# Patient Record
Sex: Female | Born: 1990 | Race: Black or African American | Hispanic: No | Marital: Single | Smoking: Never smoker
Health system: Southern US, Community
[De-identification: ages and names within clinical notes are randomized; demographics above are authoritative.]

## PROBLEM LIST (undated history)

## (undated) ENCOUNTER — Inpatient Hospital Stay (HOSPITAL_COMMUNITY): Payer: Self-pay

## (undated) DIAGNOSIS — R002 Palpitations: Secondary | ICD-10-CM

## (undated) DIAGNOSIS — Z8632 Personal history of gestational diabetes: Secondary | ICD-10-CM

## (undated) DIAGNOSIS — A6000 Herpesviral infection of urogenital system, unspecified: Secondary | ICD-10-CM

## (undated) HISTORY — DX: Personal history of gestational diabetes: Z86.32

## (undated) HISTORY — DX: Herpesviral infection of urogenital system, unspecified: A60.00

---
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
## 2010-05-28 LAB — GC/CHLAMYDIA PROBE AMP, GENITAL: GC Probe Amp, Genital: NEGATIVE

## 2010-05-28 LAB — URINALYSIS, ROUTINE W REFLEX MICROSCOPIC
Glucose, UA: NEGATIVE mg/dL
Hgb urine dipstick: NEGATIVE
Ketones, ur: NEGATIVE mg/dL
Protein, ur: NEGATIVE mg/dL

## 2010-05-28 LAB — URINE CULTURE
Colony Count: NO GROWTH
Culture: NO GROWTH

## 2010-05-28 LAB — POCT PREGNANCY, URINE: Preg Test, Ur: NEGATIVE

## 2011-03-28 ENCOUNTER — Emergency Department (HOSPITAL_COMMUNITY)
Admission: EM | Admit: 2011-03-28 | Discharge: 2011-03-29 | Disposition: A | Discharge status: Home / Self Care | Payer: PRIVATE HEALTH INSURANCE | Attending: Emergency Medicine | Admitting: Emergency Medicine

## 2011-03-28 DIAGNOSIS — R21 Rash and other nonspecific skin eruption: Secondary | ICD-10-CM | POA: Insufficient documentation

## 2011-03-28 DIAGNOSIS — N898 Other specified noninflammatory disorders of vagina: Secondary | ICD-10-CM | POA: Insufficient documentation

## 2011-03-28 DIAGNOSIS — A6000 Herpesviral infection of urogenital system, unspecified: Secondary | ICD-10-CM

## 2011-03-28 DIAGNOSIS — N949 Unspecified condition associated with female genital organs and menstrual cycle: Secondary | ICD-10-CM | POA: Insufficient documentation

## 2011-03-29 ENCOUNTER — Encounter (HOSPITAL_COMMUNITY): Payer: Self-pay | Admitting: *Deleted

## 2011-03-29 LAB — HERPES SIMPLEX VIRUS CULTURE: Culture: DETECTED

## 2011-03-29 LAB — WET PREP, GENITAL

## 2011-03-29 MED ORDER — CEFTRIAXONE SODIUM 250 MG IJ SOLR
250.0000 mg | Freq: Once | INTRAMUSCULAR | Status: AC
  Administered 2011-03-29: 250 mg via INTRAMUSCULAR
  Filled 2011-03-29: qty 250

## 2011-03-29 MED ORDER — AZITHROMYCIN 1 G PO PACK
1.0000 g | PACK | Freq: Once | ORAL | Status: AC
  Administered 2011-03-29: 1 g via ORAL
  Filled 2011-03-29: qty 1

## 2011-03-29 MED ORDER — VALACYCLOVIR HCL 1 G PO TABS: 1000.0000 mg | ORAL_TABLET | Freq: Two times a day (BID) | ORAL | Status: AC

## 2011-03-29 MED ORDER — VALACYCLOVIR HCL 500 MG PO TABS
1000.0000 mg | ORAL_TABLET | ORAL | Status: AC
  Administered 2011-03-29: 1000 mg via ORAL
  Filled 2011-03-29: qty 2

## 2011-03-29 NOTE — ED Notes (Signed)
Pelvic cart set up 

## 2011-03-29 NOTE — ED Provider Notes (Signed)
History     CSN: [id number]  Arrival date & time 03/28/11  2355   First MD Initiated Contact with Patient 03/29/11 0121      Chief Complaint  Patient presents with  . Pain    (Consider location/radiation/quality/duration/timing/severity/associated sxs/prior treatment) HPI Julie Compton is a 21 y.o. female presents with c/o vaginal rash leading to desire to be assessed in the ED. The sx(s) have been present for 1 day. Additional concerns are unprotected sexual intercourse 1 week ago. Causative factors are sex. Palliative factors are none. The distress associated is moderate. The disorder has been present for 1 day    .History reviewed. No pertinent past medical history.  History reviewed. No pertinent past surgical history.  History reviewed. No pertinent family history.  History  Substance Use Topics  . Smoking status: Never Smoker   . Smokeless tobacco: Not on file  . Alcohol Use: No    OB History    Grav Para Term Preterm Abortions TAB SAB Ect Mult Living                  Review of Systems  All other systems reviewed and are negative.    Allergies  Review of patient's allergies indicates no known allergies.  Home Medications   Current Outpatient Rx  Name Route Sig Dispense Refill  . VAGISIL YEAST-CONTROL VA SUPP Vaginal Place 1 suppository vaginally once.    Marland Kitchen VALACYCLOVIR HCL 1 G PO TABS Oral Take 1 tablet (1,000 mg total) by mouth 2 (two) times daily. 20 tablet 0    BP 123/89  Pulse 103  Temp 99 F (37.2 C)  Resp 14  SpO2 100%  Physical Exam  Nursing note and vitals reviewed. Constitutional: She is oriented to person, place, and time. She appears well-developed and well-nourished.  HENT:  Head: Normocephalic and atraumatic.  Eyes: Conjunctivae and EOM are normal. Pupils are equal, round, and reactive to light.  Neck: Normal range of motion and phonation normal. Neck supple.  Cardiovascular: Normal rate, regular rhythm and intact distal  pulses.   Pulmonary/Chest: Effort normal and breath sounds normal. She exhibits no tenderness.  Abdominal: Soft. She exhibits no distension. There is no tenderness. There is no guarding.  Genitourinary: Vaginal discharge found.       Vulvar and vaginal rash, consistent with herpes, characterized by small vesicles and ulcerations on red bases. Vaginal discharge is discolored. Patient has severe pain at the vaginal introitus due to the rash. Pain allows speculum exam, but not bimanual exam.  Musculoskeletal: Normal range of motion.  Neurological: She is alert and oriented to person, place, and time. She has normal strength. She exhibits normal muscle tone.  Skin: Skin is warm and dry.  Psychiatric: She has a normal mood and affect. Her behavior is normal. Judgment and thought content normal.    ED Course  Procedures (including critical care time)   Labs Reviewed  GC/CHLAMYDIA PROBE AMP, GENITAL  WET PREP, GENITAL  HERPES SIMPLEX VIRUS CULTURE   No results found.   1. Primary genital herpes simplex infection       MDM  Evaluation consistent with primary genital herpes. Treatment initiated in the ED for Gonorrhea Chlamydia and herpes.        Flint Melter, MD 03/29/11 [phone]

## 2011-03-29 NOTE — ED Notes (Signed)
Patient with vaginal pain and discharge.  Also c/o headache

## 2011-04-01 ENCOUNTER — Telehealth (HOSPITAL_COMMUNITY): Payer: Self-pay | Admitting: *Deleted

## 2011-04-01 NOTE — ED Notes (Signed)
Patient returned call and informed of positive results after  id'd x 2 .

## 2011-04-01 NOTE — ED Notes (Signed)
+   Herpes culture Patient treated with valtrex.

## 2012-09-23 ENCOUNTER — Encounter (HOSPITAL_COMMUNITY): Payer: Self-pay | Admitting: *Deleted

## 2012-09-23 ENCOUNTER — Emergency Department (HOSPITAL_COMMUNITY)
Admission: EM | Admit: 2012-09-23 | Discharge: 2012-09-23 | Disposition: A | Discharge status: Home / Self Care | Payer: Self-pay | Source: Home / Self Care

## 2012-09-23 DIAGNOSIS — R7309 Other abnormal glucose: Secondary | ICD-10-CM

## 2012-09-23 NOTE — ED Provider Notes (Signed)
   History    CSN: [id number] Arrival date & time 09/23/12  1645  First MD Initiated Contact with Patient 09/23/12 1814     Chief Complaint  Patient presents with  . Hyperglycemia   (Consider location/radiation/quality/duration/timing/severity/associated sxs/prior Treatment) HPI Comments: As above. Feeling drowsy and sluggish this AM. Her mother chk'd her BS on her machine twice and was 200 each time.  Denies pollyuria/phagia/dyspia. No other sx's.  History reviewed. No pertinent past medical history. History reviewed. No pertinent past surgical history. No family history on file. History  Substance Use Topics  . Smoking status: Never Smoker   . Smokeless tobacco: Not on file  . Alcohol Use: No   OB History   Grav Para Term Preterm Abortions TAB SAB Ect Mult Living                 Review of Systems  All other systems reviewed and are negative.    Allergies  Review of patient's allergies indicates no known allergies.  Home Medications   Current Outpatient Rx  Name  Route  Sig  Dispense  Refill  . Miscellaneous Vaginal Products (VAGISIL YEAST-CONTROL) SUPP   Vaginal   Place 1 suppository vaginally once.          BP 138/90  Pulse 89  Temp(Src) 98.2 F (36.8 C) (Oral)  Resp 17  SpO2 99%  LMP 09/01/2012 Physical Exam  Nursing note and vitals reviewed. Constitutional: She is oriented to person, place, and time. She appears well-developed and well-nourished. No distress.  HENT:  Head: Normocephalic and atraumatic.  Mouth/Throat: Oropharynx is clear and moist. No oropharyngeal exudate.  Eyes: EOM are normal. Pupils are equal, round, and reactive to light.  Neck: Normal range of motion. Neck supple.  Cardiovascular: Normal rate, regular rhythm and normal heart sounds.   Pulmonary/Chest: Effort normal and breath sounds normal. No respiratory distress.  Abdominal: Soft. There is no tenderness.  Musculoskeletal: Normal range of motion.  Neurological: She is alert  and oriented to person, place, and time. No cranial nerve deficit.  Skin: Skin is warm and dry.  Psychiatric: She has a normal mood and affect.    ED Course  Procedures (including critical care time) Labs Reviewed  GLUCOSE, CAPILLARY   No results found. 1. Elevated random blood glucose level     MDM  Suspect the 2 AM home readings of 200 each time may be artifactual. BS here 71. She is asymptomatic of Polyuria/dypsia/phagia. Will give diabetes info and have her call for appointment for further workup.  Hayden Rasmussen, NP 09/23/12 1844

## 2012-09-23 NOTE — ED Notes (Signed)
Patient states her mother wanted her to get checked out because she checked her CBG this morning on her mother's machine, and it was 200 (states was a fasting CBG).  Denies any c/o's or sxs.  Denies any increased thirst or polyuria.  States feels completely fine.

## 2012-09-23 NOTE — ED Provider Notes (Signed)
Medical screening examination/treatment/procedure(s) were performed by non-physician practitioner and as supervising physician I was immediately available for consultation/collaboration.  Idell Hissong   Tamana Hatfield, MD 09/23/12 1907 

## 2013-03-15 DIAGNOSIS — A6000 Herpesviral infection of urogenital system, unspecified: Secondary | ICD-10-CM

## 2013-03-15 HISTORY — DX: Herpesviral infection of urogenital system, unspecified: A60.00

## 2014-06-03 ENCOUNTER — Encounter (HOSPITAL_COMMUNITY): Payer: Self-pay

## 2014-06-03 ENCOUNTER — Other Ambulatory Visit: Payer: Self-pay

## 2014-06-03 ENCOUNTER — Emergency Department (INDEPENDENT_AMBULATORY_CARE_PROVIDER_SITE_OTHER)
Admission: EM | Admit: 2014-06-03 | Discharge: 2014-06-03 | Disposition: A | Discharge status: Home / Self Care | Payer: Self-pay | Source: Home / Self Care | Attending: Emergency Medicine | Admitting: Emergency Medicine

## 2014-06-03 DIAGNOSIS — I493 Ventricular premature depolarization: Secondary | ICD-10-CM

## 2014-06-03 DIAGNOSIS — R002 Palpitations: Secondary | ICD-10-CM

## 2014-06-03 NOTE — Discharge Instructions (Signed)
Palpitations °A palpitation is the feeling that your heartbeat is irregular or is faster than normal. It may feel like your heart is fluttering or skipping a beat. Palpitations are usually not a serious problem. However, in some cases, you may need further medical evaluation. °CAUSES  °Palpitations can be caused by: °· Smoking. °· Caffeine or other stimulants, such as diet pills or energy drinks. °· Alcohol. °· Stress and anxiety. °· Strenuous physical activity. °· Fatigue. °· Certain medicines. °· Heart disease, especially if you have a history of irregular heart rhythms (arrhythmias), such as atrial fibrillation, atrial flutter, or supraventricular tachycardia. °· An improperly working pacemaker or defibrillator. °DIAGNOSIS  °To find the cause of your palpitations, your health care provider will take your medical history and perform a physical exam. Your health care provider may also have you take a test called an ambulatory electrocardiogram (ECG). An ECG records your heartbeat patterns over a 24-hour period. You may also have other tests, such as: °· Transthoracic echocardiogram (TTE). During echocardiography, sound waves are used to evaluate how blood flows through your heart. °· Transesophageal echocardiogram (TEE). °· Cardiac monitoring. This allows your health care provider to monitor your heart rate and rhythm in real time. °· Holter monitor. This is a portable device that records your heartbeat and can help diagnose heart arrhythmias. It allows your health care provider to track your heart activity for several days, if needed. °· Stress tests by exercise or by giving medicine that makes the heart beat faster. °TREATMENT  °Treatment of palpitations depends on the cause of your symptoms and can vary greatly. Most cases of palpitations do not require any treatment other than time, relaxation, and monitoring your symptoms. Other causes, such as atrial fibrillation, atrial flutter, or supraventricular  tachycardia, usually require further treatment. °HOME CARE INSTRUCTIONS  °· Avoid: °¨ Caffeinated coffee, tea, soft drinks, diet pills, and energy drinks. °¨ Chocolate. °¨ Alcohol. °· Stop smoking if you smoke. °· Reduce your stress and anxiety. Things that can help you relax include: °¨ A method of controlling things in your body, such as your heartbeats, with your mind (biofeedback). °¨ Yoga. °¨ Meditation. °¨ Physical activity such as swimming, jogging, or walking. °· Get plenty of rest and sleep. °SEEK MEDICAL CARE IF:  °· You continue to have a fast or irregular heartbeat beyond 24 hours. °· Your palpitations occur more often. °SEEK IMMEDIATE MEDICAL CARE IF: °· You have chest pain or shortness of breath. °· You have a severe headache. °· You feel dizzy or you faint. °MAKE SURE YOU: °· Understand these instructions. °· Will watch your condition. °· Will get help right away if you are not doing well or get worse. °Document Released: 02/27/2000 Document Revised: 03/06/2013 Document Reviewed: 04/30/2011 °ExitCare® Patient Information ©2015 [employer]. This information is not intended to replace advice given to you by your health care provider. Make sure you discuss any questions you have with your health care provider. °Premature Ventricular Contraction °Premature ventricular contraction (PVC) is an irregularity of the heart rhythm involving extra or skipped heartbeats. In some cases, they may occur without obvious cause or heart disease. °Other times, they can be caused by an electrolyte change in the blood. These need to be corrected. They can also be seen when there is not enough oxygen going to the heart. A common cause of this is plaque or cholesterol buildup. This buildup decreases the blood supply to the heart. In addition, extra beats may be caused or aggravated by: °·   Excessive smoking. °· Alcohol consumption. °· Caffeine. °· Certain medications °· Some street drugs. °SYMPTOMS  °· The sensation of  feeling your heart skipping a beat (palpitations). °· In many cases, the person may have no symptoms. °SIGNS AND TESTS  °· A physical examination may show an occasional irregularity, but if the PVC beats do not happen often, they may not be found on physical exam. °· Blood pressure is usually normal. °· Other tests that may find extra beats of the heart are: °¨ An EKG (electrocardiogram) °¨ A Holter monitor which can monitor your heart over longer periods of time °¨ An Angiogram (study of the heart arteries). °TREATMENT  °Usually extra heartbeats do not need treatment. The condition is treated only if symptoms are severe or if extra beats are very frequent or are causing problems. An underlying cause, if discovered, may also require treatment.  °Treatment may also be needed if there may be a risk for other more serious cardiac arrhythmias.  °PREVENTION  °· Moderation in caffeine, alcohol, and tobacco use may reduce the risk of ectopic heartbeats in some people. °· Exercise often helps people who lead a sedentary (inactive) lifestyle. °PROGNOSIS  °PVC heartbeats are generally harmless and do not need treatment.  °RISKS AND COMPLICATIONS  °· Ventricular tachycardia (occasionally). °· There usually are no complications. °· Other arrhythmias (occasionally). °SEEK IMMEDIATE MEDICAL CARE IF:  °· You feel palpitations that are frequent or continual. °· You develop chest pain or other problems such as shortness of breath, sweating, or nausea and vomiting. °· You become light-headed or faint (pass out). °· You get worse or do not improve with treatment. °Document Released: 10/17/2003 Document Revised: 05/24/2011 Document Reviewed: 04/28/2007 °ExitCare® Patient Information ©2015 [employer]. This information is not intended to replace advice given to you by your health care provider. Make sure you discuss any questions you have with your health care provider. ° °

## 2014-06-03 NOTE — ED Notes (Signed)
C/o 3 week duration of palpitations. Denies n/v/SOB, sweats

## 2014-06-03 NOTE — ED Provider Notes (Signed)
CSN: [id number]     Arrival date & time 06/03/14  1919 History   First MD Initiated Contact with Patient 06/03/14 1959     Chief Complaint  Patient presents with  . Palpitations   (Consider location/radiation/quality/duration/timing/severity/associated sxs/prior Treatment) HPI          24 year old female presents for evaluation of palpitations. For 3 weeks she has intermittently felt her heart skip a beat. This happens a few times a day. She thinks it maybe one time she felt slightly short of breath but other than that this is been asymptomatic apart from the feeling of palpitations. Denies any other associated symptoms  No past medical history on file. No past surgical history on file. No family history on file. History  Substance Use Topics  . Smoking status: Never Smoker   . Smokeless tobacco: Not on file  . Alcohol Use: No   OB History    No data available     Review of Systems  Respiratory: Positive for shortness of breath.   Cardiovascular: Positive for palpitations.  All other systems reviewed and are negative.   Allergies  Review of patient's allergies indicates no known allergies.  Home Medications   Prior to Admission medications   Medication Sig Start Date End Date Taking? Authorizing Provider  Miscellaneous Vaginal Products (VAGISIL YEAST-CONTROL) SUPP Place 1 suppository vaginally once.    Historical Provider, MD   BP 144/81 mmHg  Pulse 85  Temp(Src) 99.1 F (37.3 C) (Oral)  Resp 16  SpO2 100% Physical Exam  Constitutional: She is oriented to person, place, and time. Vital signs are normal. She appears well-developed and well-nourished. No distress.  HENT:  Head: Normocephalic and atraumatic.  Cardiovascular: Normal rate, regular rhythm, normal heart sounds and normal pulses.   Pulmonary/Chest: Effort normal and breath sounds normal. No respiratory distress.  Neurological: She is alert and oriented to person, place, and time. She has normal strength.  Coordination normal.  Skin: Skin is warm and dry. No rash noted. She is not diaphoretic.  Psychiatric: She has a normal mood and affect. Judgment normal.  Nursing note and vitals reviewed.   ED Course  ED EKG  Date/Time: 06/03/2014 8:15 PM Performed by: Graylon GoodBAKER, Clemmie Buelna H Authorized by: Charm RingsHONIG, ERIN J Comparison: not compared with previous ECG  Rhythm: sinus rhythm Rate: normal QRS axis: normal Conduction: conduction normal ST Segments: ST segments normal T Waves: T waves normal Other: no other findings Clinical impression: normal ECG Comments: EKG reviewed as above Rhythm strip revealed 3 unifocal PVCs   (including critical care time) Labs Review Labs Reviewed - No data to display  Imaging Review No results found.   MDM   1. Palpitations   2. PVC (premature ventricular contraction)    PVC, this explains the palpitations. No treatment indicated. I provided her information for the cardiologist's office, she may follow-up if this starts to bother her. All questions answered.       Graylon GoodZachary H Shaterria Sager, PA-C 06/03/14 2020

## 2015-02-26 ENCOUNTER — Emergency Department (HOSPITAL_COMMUNITY)
Admission: EM | Admit: 2015-02-26 | Discharge: 2015-02-26 | Disposition: A | Discharge status: Home / Self Care | Payer: No Typology Code available for payment source | Attending: Emergency Medicine | Admitting: Emergency Medicine

## 2015-02-26 ENCOUNTER — Encounter (HOSPITAL_COMMUNITY): Payer: Self-pay | Admitting: Emergency Medicine

## 2015-02-26 DIAGNOSIS — Z792 Long term (current) use of antibiotics: Secondary | ICD-10-CM | POA: Insufficient documentation

## 2015-02-26 DIAGNOSIS — H7292 Unspecified perforation of tympanic membrane, left ear: Secondary | ICD-10-CM

## 2015-02-26 DIAGNOSIS — W228XXA Striking against or struck by other objects, initial encounter: Secondary | ICD-10-CM | POA: Insufficient documentation

## 2015-02-26 DIAGNOSIS — Y9389 Activity, other specified: Secondary | ICD-10-CM | POA: Insufficient documentation

## 2015-02-26 DIAGNOSIS — S0922XA Traumatic rupture of left ear drum, initial encounter: Secondary | ICD-10-CM | POA: Insufficient documentation

## 2015-02-26 DIAGNOSIS — Y9289 Other specified places as the place of occurrence of the external cause: Secondary | ICD-10-CM | POA: Insufficient documentation

## 2015-02-26 DIAGNOSIS — Y998 Other external cause status: Secondary | ICD-10-CM | POA: Insufficient documentation

## 2015-02-26 MED ORDER — OFLOXACIN 0.3 % OP SOLN: 5.0000 [drp] | Freq: Two times a day (BID) | OPHTHALMIC | Status: DC

## 2015-02-26 MED ORDER — OFLOXACIN 0.3 % OT SOLN: 5.0000 [drp] | Freq: Two times a day (BID) | OTIC | Status: AC

## 2015-02-26 NOTE — ED Provider Notes (Signed)
CSN: [id number]     Arrival date & time 02/26/15  1350 History  By signing my name below, I, Octavia Heir, attest that this documentation has been prepared under the direction and in the presence of Melburn Hake, PA-C. Electronically Signed: Octavia Heir, ED Scribe. 02/26/2015. 3:21 PM.     Chief Complaint  Patient presents with  . Otalgia      The history is provided by the patient. No language interpreter was used.   HPI Comments: Julie Compton is a 24 y.o. female who presents to the Emergency Department complaining of acute onset, gradual worsening left ear pain onset 2 days ago. She notes she is unable to hear very well in her left ear that she states sounds muffled. Pt notes she was cleaning out her ears with a Q-Tip on Monday when she bumped into something and the Q-tip went all the way into her ear. Pt explains her ear started bleeding and has bled twice since Monday. She has not taken any medication to alleviate the pain and she has not had this happen to her before.   History reviewed. No pertinent past medical history. No past surgical history on file. No family history on file. Social History  Substance Use Topics  . Smoking status: Never Smoker   . Smokeless tobacco: None  . Alcohol Use: No   OB History    No data available     Review of Systems  HENT: Positive for ear discharge and ear pain.   All other systems reviewed and are negative.     Allergies  Review of patient's allergies indicates no known allergies.  Home Medications   Prior to Admission medications   Medication Sig Start Date End Date Taking? Authorizing Provider  Miscellaneous Vaginal Products (VAGISIL YEAST-CONTROL) SUPP Place 1 suppository vaginally once.    Historical Provider, MD  ofloxacin (FLOXIN) 0.3 % otic solution Place 5 drops into the left ear 2 (two) times daily. 02/26/15 03/02/15  Barrett Henle, PA-C   Triage vitals: BP 132/89 mmHg  Pulse 76  Temp(Src) 98.7 F  (37.1 C) (Oral)  Resp 18  SpO2 100% Physical Exam  Constitutional: She appears well-developed and well-nourished. No distress.  HENT:  Head: Normocephalic and atraumatic.  Right Ear: Hearing, tympanic membrane, external ear and ear canal normal. No drainage or tenderness. No foreign bodies. No mastoid tenderness. No middle ear effusion. No hemotympanum. No decreased hearing is noted.  Left Ear: Hearing, external ear and ear canal normal. No swelling or tenderness. No foreign bodies. No mastoid tenderness. Tympanic membrane is perforated.  Small amount of dried blood noted to left ear canal and left posterior TM, no active bleeding, left TM appears to have small perforation to 3 o'clock region  Eyes: Right eye exhibits no discharge. Left eye exhibits no discharge.  Pulmonary/Chest: Effort normal. No respiratory distress.  Neurological: She is alert. Coordination normal.  Skin: No rash noted. She is not diaphoretic.  Psychiatric: She has a normal mood and affect. Her behavior is normal.  Nursing note and vitals reviewed.   ED Course  Procedures  DIAGNOSTIC STUDIES: Oxygen Saturation is 100% on RA, normal by my interpretation.  COORDINATION OF CARE:  3:04 PM Discussed treatment plan with pt at bedside and pt agreed to plan.  Labs Review Labs Reviewed - No data to display  Imaging Review No results found. I have personally reviewed and evaluated these images and lab results as part of my medical decision-making.  Filed Vitals:  02/26/15 1359  BP: 132/89  Pulse: 76  Temp: 98.7 F (37.1 C)  Resp: 18     MDM   Final diagnoses:  Perforated tympanic membrane, left    Patient presents with pain and reported bleeding to left ear after sticking a Q-tip in her ear canal 2 days ago. Endorses having mild change in hearing.VSS. Exam revealed small amount of dried blood in left ear canal with small amount of blood noted to left posterior TM. Small left TM perforation noted to 3  o'clock region. Patient's hearing grossly intact during exam, otherwise no signs of infection or trauma. Plan to discharge patient home with Ofloxacin eardrops advised patient to keep water out of ear canal. Advised patient to follow up with primary care provider, patient given resource guide.   Evaluation does not show pathology requring ongoing emergent intervention or admission. Pt is hemodynamically stable and mentating appropriately. Discussed findings/results and plan with patient/guardian, who agrees with plan. All questions answered. Return precautions discussed and outpatient follow up given.    I personally performed the services described in this documentation, which was scribed in my presence. The recorded information has been reviewed and is accurate.   Satira Sarkicole Elizabeth Oak GroveNadeau, New JerseyPA-C 02/26/15 1549  Azalia BilisKevin Campos, MD 02/26/15 [phone]

## 2015-02-26 NOTE — Discharge Instructions (Signed)
Apply 5 drops to her left ear 2 times daily for 5 days. Keep your left ear/ear canal dry and try to prevent water from entering your ear canal. Follow-up with her primary care provider within the next 4 weeks. I have also listed above the contact information for ENT follow-up as needed. Return to the emergency department if symptoms worsen or new onset of ear pain, fever, change in hearing, dizziness, facial weakness, ear discharge.   Emergency Department Resource Guide 1) Find a Doctor and Pay Out of Pocket Although you won't have to find out who is covered by your insurance plan, it is a good idea to ask around and get recommendations. You will then need to call the office and see if the doctor you have chosen will accept you as a new patient and what types of options they offer for patients who are self-pay. Some doctors offer discounts or will set up payment plans for their patients who do not have insurance, but you will need to ask so you aren't surprised when you get to your appointment.  2) Contact Your Local Health Department Not all health departments have doctors that can see patients for sick visits, but many do, so it is worth a call to see if yours does. If you don't know where your local health department is, you can check in your phone book. The CDC also has a tool to help you locate your state's health department, and many state websites also have listings of all of their local health departments.  3) Find a Walk-in Clinic If your illness is not likely to be very severe or complicated, you may want to try a walk in clinic. These are popping up all over the country in pharmacies, drugstores, and shopping centers. They're usually staffed by nurse practitioners or physician assistants that have been trained to treat common illnesses and complaints. They're usually fairly quick and inexpensive. However, if you have serious medical issues or chronic medical problems, these are probably not  your best option.  No Primary Care Doctor: - Call Health Connect at  [phone] - they can help you locate a primary care doctor that  accepts your insurance, provides certain services, etc. - Physician Referral Service- [phone]  Chronic Pain Problems: Organization         Address  Phone   Notes  [facility]  [phone] Patients need to be referred by their primary care doctor.   Medication Assistance: Organization         Address  Phone   Notes  [facility] Medication [facility] [address] [place] [place], [place] [address] [phone] --Must be a resident of [facility] -- Must have NO insurance coverage whatsoever (no Medicaid/ Medicare, etc.) -- The pt. MUST have a primary care doctor that directs their care regularly and follows them in the community   MedAssist  [phone]   [employer] [place]  [phone]    Agencies that provide inexpensive medical care: Organization         Address  Phone   Notes  [facility]  [phone]   [facility]    [phone]   [facility] [address] [place], [place] [address] [phone]   [facility] [place] [address] [place] [place] [phone]   Planned Parenthood    ([phone]   [facility]    [phone]  [facility] [place]  [address], [place] Phone:  [phone], Fax:  [phone] Hours of Operation:  9 am - 6 pm, M-F.  Also accepts Medicaid/Medicare and self-pay.  [facility] [place] [address], [address], [place] Phone: [phone], Fax: [phone]. Hours of Operation:  8:30 am - 5:30 pm, M-F.  Also accepts Medicaid and self-pay.  [facility] [place] [address], [place] Phone: [phone]   [place], [place], [place] [phone], Ext. [phone] Mondays & Thursdays: 7-9 AM.  First  15 patients are seen on a first come, first serve basis.    [place] Providers:  Organization         Address  Phone   Notes  [facility] [address], [place] [phone] Also accepts self-pay patients.  [facility] [id number] [place], [place]  [phone]   [place], [address], [place] [phone]   [facility] [address] [place] [phone]   Lucianne Lei [address], [place]   [phone] Only accepts [place] Access [place] patients after they have their name applied to their card.   Self-Pay (no insurance) in [facility]:  Organization         Address  Phone   Notes  Sickle Cell Patients, [facility] Internal Medicine [place] [phone]   [facility] [place] [phone]   [facility] [place]  [place], [place], [place] [phone]   [facility]/Dr. Osei-Bonsu  [address], [place] or [address], [place] [phone] Phone number for both [place] and [place] locations is the same.  [facility] [address], [place] [phone]   [facility] [address] [place] or [address] [phone]   [facility] [address], [place] [phone], phone; [phone], fax Sees patients 1st and 3rd Saturday of every month.  Must not qualify for public or private insurance (i.e. Medicaid, Medicare, [place] Health Choice, Veterans' Benefits)  Household income should be no more than 200% of the poverty level The clinic cannot treat you if you are pregnant or think you are pregnant  Sexually transmitted diseases are not treated at the clinic.    Dental  Care: Organization         Address  Phone  Notes  [facility] [place] [phone] Accepts children up to age 70 who are enrolled in [place] or [place]; pregnant women with a Medicaid card; and children who have applied for Medicaid or [place] Health Choice, but were declined, whose parents can pay a reduced fee at time of service.  [facility]  [address], [place] [phone] Accepts children up to age 69 who are enrolled in [place] or [place]; pregnant women with a Medicaid card; and children who have applied for Medicaid or [place] Health Choice, but were declined, whose parents can pay a reduced fee at time of service.  [place]  [phone]  [place] [phone] Patients are seen by appointment only. Walk-ins are not accepted. [place] will see patients 26 years of age and older. Monday - Tuesday (8am-5pm) Most Wednesdays (8:30-5pm) $30 per visit, cash only  [facility] Adult Dental Access PROGRAM  [address], [facility] [phone] Patients are seen by appointment only. Walk-ins are not accepted. [place] will see patients 62 years of age and older. One Wednesday Evening (Monthly: Volunteer Based).  $30 per visit, cash only  [place]  [phone] for adults; Children under age 39, call Graduate Pediatric Dentistry at [phone]. Children aged 75-14, please call [phone] to request a pediatric application.  Dental services are provided in all areas of dental care including fillings, crowns and bridges, complete and partial dentures, implants, gum treatment, root canals, and extractions. Preventive care is also provided. Treatment is provided to both adults and children. Patients are selected via a lottery and there is often a waiting list.   [facility] [address] [place]  [phone] [email or web address]   [facility] [address] [place], [place] [phone], Ext. [phone] Second and Fourth Thursday of each month, opens at 6:30 AM; Clinic ends at 9 AM.  Patients are seen on a first-come first-served basis, and a limited number are seen during each clinic.   [facility]  [address] Hillard Danker [place], [place] [phone]   Eligibility Requirements You must have lived in [place], [place], or [place] counties for at least the last three months.   You cannot be eligible for state or federal sponsored [employer], including [employer], [place], or [employer].   You generally cannot be eligible for healthcare insurance through your employer.    How to apply: Eligibility screenings are held every Tuesday and Wednesday afternoon from 1:00 pm until 4:00 pm. You do not need an appointment for the interview!  [facility] [address] [place], [place]   [place]  [place] Department  [place]  [phone]    Behavioral Health Resources in the Community: Intensive Outpatient Programs Organization         Address  Phone  Notes  [place]. [address] [place], [place] [phone]   [facility]patient [address] [place], [place]   ADS: [facility] Drug [facility] [address], [place], [place]   [place] [address]  [place], [place] or [phone]   Substance Abuse Resources Organization         Address  Phone  Notes  Alcohol and Drug Services  [phone]   [place]  [phone]   The [place]   Chinita Pester  [phone]   Residential & Outpatient Substance Abuse Program  [phone]    Psychological Services Organization         Address  Phone  Notes  [facility] [place]  [place]  [phone]   [place] [address] [place] or [phone]    Mobile Crisis Teams Organization         Address  Phone  Notes  [facility], Mobile Crisis Care Unit  [phone]   [facility]  [address] [place], [place]  [facility] De[facility]sch [address] [place] [phone]    Self-Help/Support Groups Organization         Address  Phone             Notes  Mental Health Assoc. of  - variety of support groups  336- [phone] Call for more information  Narcotics Anonymous (NA), [facility]aring [facility] [address], [place]  2 meetings at this location   Statisticianesidential Treatment Programs Organization         Address  Phone  Notes  [facility] [address],    [place]  [phone]   [facility]  [address], [place] [phone], [place], [place] [phone]   [facility] [address], [place] [phone] Admissions: 8am-3pm M-F  [facility] [address]    [place], [place] [phone]   [facility] [address], [place], [place] [phone]   [facility] [address]  [place], [place] [phone]   [facility] - Intensive Outpatient [address], [place], [place] [phone]   [facility] (Addiction Recovery Care [facility] [address]  [place], [place] [phone] or [phone]   [facility] Services (RTS) [address] [place], [place] [phone] Accepts Medicaid  [facility] [address]  [place] [phone] Substance Abuse/Addiction Treatment   [facility] Behavioral Health Resources Organization         Address  Phone  Notes  [facility]  [phone]) [phone]   Angie FavaJulie Brannon, PhD [address], [address] [place], [place]   [phone](336) [phone] or [phone]) [phone]   [facility]   [address] [place], [place] [phone]) [phone]   [facility] [address], [place], [place] [phone]) [phone] Insurance/Medicaid/sponsorship through [facility]  [facility] Families [address]                                    [place], [place] [phone]) [phone] Therapy/tele-psych/case  [facility] [address]   [place], [place] [phone](336) [phone]    Dr. Lolly MustacheArfeen  [phone]) [phone]   [facility] [place]  [facility] 1) [address], [place] 2) [address], [place] 3)  [address], [place] [phone](336) [phone](336) [phone]  [phone]([phone]   [facility] Child Abuse Hotline [phone](336) 9597750658 or [phone](336) [phone] (After Hours)

## 2015-02-26 NOTE — ED Notes (Signed)
Pt states that she was cleaning out her ears on Monday and accidentally stuck the qtip too far into her ear and made it bleed.  C/o pain to lt ear since.

## 2016-12-23 ENCOUNTER — Ambulatory Visit (HOSPITAL_COMMUNITY)
Admission: EM | Admit: 2016-12-23 | Discharge: 2016-12-23 | Disposition: A | Discharge status: Home / Self Care | Payer: Self-pay | Attending: Family Medicine | Admitting: Family Medicine

## 2016-12-23 ENCOUNTER — Encounter (HOSPITAL_COMMUNITY): Payer: Self-pay | Admitting: Emergency Medicine

## 2016-12-23 DIAGNOSIS — L03115 Cellulitis of right lower limb: Secondary | ICD-10-CM

## 2016-12-23 LAB — POCT PREGNANCY, URINE: PREG TEST UR: NEGATIVE

## 2016-12-23 MED ORDER — DOXYCYCLINE HYCLATE 100 MG PO TABS: 100.0000 mg | ORAL_TABLET | Freq: Two times a day (BID) | ORAL | 0 refills | Status: DC

## 2016-12-23 NOTE — ED Provider Notes (Signed)
  [facility]   [id number] 12/23/16 Arrival Time: 1144   SUBJECTIVE:  Julie Compton is a 26 y.o. female who presents to the urgent care with complaint of right calf pain of several days duration.  Spontaneously developed pain, now is red with central small pustule.   Does not shave legs. Works examining product under microscopel  History reviewed. No pertinent past medical history. No family history on file. Social History   Social History  . Marital status: Single    Spouse name: N/A  . Number of children: N/A  . Years of education: N/A   Occupational History  . Not on file.   Social History Main Topics  . Smoking status: Never Smoker  . Smokeless tobacco: Not on file  . Alcohol use No  . Drug use: No  . Sexual activity: Not on file   Other Topics Concern  . Not on file   Social History Narrative  . No narrative on file   No outpatient prescriptions have been marked as taking for the 12/23/16 encounter [facility] Encounter).   No Known Allergies    ROS: As per HPI, remainder of ROS negative.   OBJECTIVE:   Vitals:   12/23/16 1208  BP: 139/79  Pulse: 89  Resp: 16  Temp: 98.2 F (36.8 C)  TempSrc: Oral  SpO2: 100%  Weight: 190 lb (86.2 kg)  Height:  (1.676 m)     General appearance: alert; no distress Eyes: PERRL; EOMI; conjunctiva normal HENT: normocephalic; atraumatic; TMs normal, canal normal, external ears normal without trauma; nasal mucosa normal; oral mucosa normal Neck: supple Extremities: no cyanosis or edema; symmetrical with no gross deformities Skin: warm and dry, 1.5 cm red slightly raised tender area on posterior right calf with tiny central pustule. Neurologic: antalgic gait; grossly normal Psychological: alert and cooperative; normal mood and affect      Labs:  Results for orders placed or performed during the hospital encounter of 09/23/12  Glucose, capillary  Result Value Ref Range   Glucose-Capillary 71 70 - 99 mg/dL    Labs Reviewed  POCT PREGNANCY, URINE    No results found.     ASSESSMENT & PLAN:  1. Cellulitis of leg, right     Meds ordered this encounter  Medications  . doxycycline (VIBRA-TABS) 100 MG tablet    Sig: Take 1 tablet (100 mg total) by mouth 2 (two) times daily.    Dispense:  20 tablet    Refill:  0    Reviewed expectations re: course of current medical issues. Questions answered. Outlined signs and symptoms indicating need for more acute intervention. Patient verbalized understanding. After Visit Summary given.    Procedures:      Elvina Sidle, MD 12/23/16 1241

## 2017-01-29 ENCOUNTER — Encounter (HOSPITAL_COMMUNITY): Payer: Self-pay | Admitting: Emergency Medicine

## 2017-01-29 ENCOUNTER — Ambulatory Visit (HOSPITAL_COMMUNITY)
Admission: EM | Admit: 2017-01-29 | Discharge: 2017-01-29 | Disposition: A | Discharge status: Home / Self Care | Payer: Self-pay | Attending: Family Medicine | Admitting: Family Medicine

## 2017-01-29 ENCOUNTER — Other Ambulatory Visit: Payer: Self-pay

## 2017-01-29 DIAGNOSIS — K5901 Slow transit constipation: Secondary | ICD-10-CM

## 2017-01-29 DIAGNOSIS — Z3201 Encounter for pregnancy test, result positive: Secondary | ICD-10-CM

## 2017-01-29 DIAGNOSIS — R102 Pelvic and perineal pain: Secondary | ICD-10-CM

## 2017-01-29 DIAGNOSIS — R109 Unspecified abdominal pain: Secondary | ICD-10-CM

## 2017-01-29 LAB — POCT PREGNANCY, URINE: Preg Test, Ur: POSITIVE — AB

## 2017-01-29 NOTE — Discharge Instructions (Signed)
If you develop increased pelvic cramping, pain and associated with bleeding, nausea or vomiting go to the emergency department promptly or call 911. Also increase her fiber in your diet and use MiraLAX for constipation as directed.

## 2017-01-29 NOTE — ED Notes (Signed)
Sent patient to bathroom for clean catch specimen.

## 2017-01-29 NOTE — ED Provider Notes (Signed)
[facility]    CSN: [id number] Arrival date & time: 01/29/17  1232     History   Chief Complaint Chief Complaint  Patient presents with  . Abdominal Pain    HPI Julie Compton is a 26 y.o. female.   26 year old female complaining of intermittent cramping for about a week. Last time she had this was 3 days ago. When it occurs it is described as mild and will last up to 30 seconds. No discomfort or abdominal or pelvic pain today. She has had no urinary symptoms and no vaginal discharge or bleeding. Denies gastric intestinal symptoms, fever, chills. She states that she has irregular bowel movements and the last one she had was small volume of hard stool.  LMP was 12/17/2016. The one before that was approximately 30 days before and right on time.       History reviewed. No pertinent past medical history.  There are no active problems to display for this patient.   History reviewed. No pertinent surgical history.  OB History    No data available       Home Medications    Prior to Admission medications   Medication Sig Start Date End Date Taking? Authorizing Provider  doxycycline (VIBRA-TABS) 100 MG tablet Take 1 tablet (100 mg total) by mouth 2 (two) times daily. 12/23/16   Elvina SidleLauenstein, Kurt, MD    Family History Family History  Problem Relation Age of Onset  . Diabetes Mother     Social History Social History   Tobacco Use  . Smoking status: Never Smoker  Substance Use Topics  . Alcohol use: No  . Drug use: No     Allergies   Patient has no known allergies.   Review of Systems Review of Systems  Constitutional: Negative.   Gastrointestinal: Negative for abdominal distention, blood in stool, nausea and vomiting.  Genitourinary:       As per history of present illness  Musculoskeletal: Negative.   All other systems reviewed and are negative.    Physical Exam Triage Vital Signs ED Triage Vitals  Enc Vitals Group     BP 01/29/17  1352 116/81     Pulse Rate 01/29/17 1352 71     Resp 01/29/17 1352 18     Temp 01/29/17 1352 98.1 F (36.7 C)     Temp Source 01/29/17 1352 Oral     SpO2 01/29/17 1352 100 %     Weight --      Height --      Head Circumference --      Peak Flow --      Pain Score 01/29/17 1350 3     Pain Loc --      Pain Edu? --      Excl. in GC? --    No data found.  Updated Vital Signs BP 116/81 (BP Location: Left Arm)   Pulse 71   Temp 98.1 F (36.7 C) (Oral)   Resp 18   LMP 12/17/2016   SpO2 100%   Visual Acuity Right Eye Distance:   Left Eye Distance:   Bilateral Distance:    Right Eye Near:   Left Eye Near:    Bilateral Near:     Physical Exam  Constitutional: She is oriented to person, place, and time. She appears well-developed and well-nourished. No distress.  Eyes: EOM are normal.  Neck: Normal range of motion. Neck supple.  Cardiovascular: Normal rate.  Pulmonary/Chest: Effort normal. No respiratory distress.  Abdominal:  Bowel sounds are normal. She exhibits no shifting dullness, no distension and no ascites.  Very minor tenderness over the mid suprapubic area. No other abdominal tenderness. Abdomen soft. No masses, guarding or rebound.  Musculoskeletal: She exhibits no edema.  Neurological: She is alert and oriented to person, place, and time. She exhibits normal muscle tone.  Skin: Skin is warm and dry. Capillary refill takes less than 2 seconds.  Psychiatric: She has a normal mood and affect.  Nursing note and vitals reviewed.    UC Treatments / Results  Labs (all labs ordered are listed, but only abnormal results are displayed) Labs Reviewed  POCT PREGNANCY, URINE - Abnormal; Notable for the following components:      Result Value   Preg Test, Ur POSITIVE (*)    All other components within normal limits    EKG  EKG Interpretation None       Radiology No results found.  Procedures Procedures (including critical care time)  Medications Ordered  in UC Medications - No data to display   Initial Impression / Assessment and Plan / UC Course  I have reviewed the triage vital signs and the nursing notes.  Pertinent labs & imaging results that were available during my care of the patient were reviewed by me and considered in my medical decision making (see chart for details).    f you develop increased pelvic cramping, pain and associated with bleeding, nausea or vomiting go to the emergency department promptly or call 911. Also increase her fiber in your diet and use MiraLAX for constipation as directed.     Final Clinical Impressions(s) / UC Diagnoses   Final diagnoses:  Positive pregnancy test  Pelvic cramping  Slow transit constipation    ED Discharge Orders    None       Controlled Substance Prescriptions [place] Controlled Substance Registry consulted? Not Applicable   Hayden RasmussenMabe, Lalla Laham, NP 01/29/17 1424

## 2017-01-29 NOTE — ED Triage Notes (Signed)
Complains of abdominal pain-described as slight cramping, but no period.  Last period was October 5.  Denies urinary symptoms.  Denies vaginal discharge

## 2017-03-11 LAB — OB RESULTS CONSOLE ABO/RH: RH TYPE: POSITIVE

## 2017-03-11 LAB — OB RESULTS CONSOLE GC/CHLAMYDIA
CHLAMYDIA, DNA PROBE: NEGATIVE
Gonorrhea: NEGATIVE

## 2017-03-11 LAB — OB RESULTS CONSOLE HGB/HCT, BLOOD
HCT: 34
Hemoglobin: 11.2

## 2017-03-11 LAB — OB RESULTS CONSOLE ANTIBODY SCREEN: ANTIBODY SCREEN: NEGATIVE

## 2017-03-11 LAB — OB RESULTS CONSOLE PLATELET COUNT: PLATELETS: 293

## 2017-03-11 LAB — OB RESULTS CONSOLE RUBELLA ANTIBODY, IGM: RUBELLA: IMMUNE

## 2017-03-11 LAB — OB RESULTS CONSOLE RPR: RPR: NONREACTIVE

## 2017-03-11 LAB — SICKLE CELL SCREEN: Sickle Cell Screen: NORMAL

## 2017-03-11 LAB — OB RESULTS CONSOLE VARICELLA ZOSTER ANTIBODY, IGG: VARICELLA IGG: IMMUNE

## 2017-03-11 LAB — OB RESULTS CONSOLE HIV ANTIBODY (ROUTINE TESTING): HIV: NONREACTIVE

## 2017-03-11 LAB — OB RESULTS CONSOLE HEPATITIS B SURFACE ANTIGEN: Hepatitis B Surface Ag: NEGATIVE

## 2017-03-11 LAB — CYSTIC FIBROSIS DIAGNOSTIC STUDY: INTERPRETATION-CFDNA: NEGATIVE

## 2017-03-14 ENCOUNTER — Other Ambulatory Visit (HOSPITAL_COMMUNITY): Payer: Self-pay | Admitting: Nurse Practitioner

## 2017-03-14 DIAGNOSIS — Z3A13 13 weeks gestation of pregnancy: Secondary | ICD-10-CM

## 2017-03-14 DIAGNOSIS — Z3682 Encounter for antenatal screening for nuchal translucency: Secondary | ICD-10-CM

## 2017-03-21 ENCOUNTER — Encounter (HOSPITAL_COMMUNITY): Payer: Self-pay | Admitting: *Deleted

## 2017-03-22 ENCOUNTER — Ambulatory Visit (HOSPITAL_COMMUNITY): Admission: RE | Admit: 2017-03-22 | Payer: Medicaid Other | Source: Ambulatory Visit

## 2017-03-22 ENCOUNTER — Encounter (HOSPITAL_COMMUNITY): Payer: Self-pay

## 2017-03-22 ENCOUNTER — Other Ambulatory Visit (HOSPITAL_COMMUNITY): Payer: Self-pay | Admitting: *Deleted

## 2017-03-22 ENCOUNTER — Other Ambulatory Visit (HOSPITAL_COMMUNITY): Payer: Self-pay | Admitting: Nurse Practitioner

## 2017-03-22 ENCOUNTER — Ambulatory Visit (HOSPITAL_COMMUNITY)
Admission: RE | Admit: 2017-03-22 | Discharge: 2017-03-22 | Disposition: A | Discharge status: Home / Self Care | Payer: Medicaid Other | Source: Ambulatory Visit | Attending: Nurse Practitioner | Admitting: Nurse Practitioner

## 2017-03-22 DIAGNOSIS — Z3682 Encounter for antenatal screening for nuchal translucency: Secondary | ICD-10-CM | POA: Insufficient documentation

## 2017-03-22 DIAGNOSIS — Z3A13 13 weeks gestation of pregnancy: Secondary | ICD-10-CM

## 2017-03-22 DIAGNOSIS — Z369 Encounter for antenatal screening, unspecified: Secondary | ICD-10-CM

## 2017-03-22 HISTORY — DX: Palpitations: R00.2

## 2017-03-25 ENCOUNTER — Encounter (HOSPITAL_COMMUNITY): Payer: Self-pay

## 2017-03-25 ENCOUNTER — Other Ambulatory Visit (HOSPITAL_COMMUNITY): Payer: Self-pay | Admitting: Obstetrics and Gynecology

## 2017-03-25 ENCOUNTER — Ambulatory Visit (HOSPITAL_COMMUNITY)
Admission: RE | Admit: 2017-03-25 | Discharge: 2017-03-25 | Disposition: A | Discharge status: Home / Self Care | Payer: Medicaid Other | Source: Ambulatory Visit | Attending: Nurse Practitioner | Admitting: Nurse Practitioner

## 2017-03-25 DIAGNOSIS — Z369 Encounter for antenatal screening, unspecified: Secondary | ICD-10-CM

## 2017-03-25 DIAGNOSIS — Z3A14 14 weeks gestation of pregnancy: Secondary | ICD-10-CM | POA: Insufficient documentation

## 2017-03-25 DIAGNOSIS — Z3682 Encounter for antenatal screening for nuchal translucency: Secondary | ICD-10-CM | POA: Diagnosis not present

## 2017-05-05 ENCOUNTER — Inpatient Hospital Stay (HOSPITAL_COMMUNITY)
Admission: AD | Admit: 2017-05-05 | Discharge: 2017-05-05 | Disposition: A | Discharge status: Home / Self Care | Payer: Medicaid Other | Source: Ambulatory Visit | Attending: Obstetrics & Gynecology | Admitting: Obstetrics & Gynecology

## 2017-05-05 ENCOUNTER — Encounter (HOSPITAL_COMMUNITY): Payer: Self-pay

## 2017-05-05 DIAGNOSIS — Z833 Family history of diabetes mellitus: Secondary | ICD-10-CM | POA: Insufficient documentation

## 2017-05-05 DIAGNOSIS — O99512 Diseases of the respiratory system complicating pregnancy, second trimester: Secondary | ICD-10-CM | POA: Diagnosis not present

## 2017-05-05 DIAGNOSIS — Z3A19 19 weeks gestation of pregnancy: Secondary | ICD-10-CM | POA: Insufficient documentation

## 2017-05-05 DIAGNOSIS — R0981 Nasal congestion: Secondary | ICD-10-CM | POA: Diagnosis not present

## 2017-05-05 DIAGNOSIS — J069 Acute upper respiratory infection, unspecified: Secondary | ICD-10-CM

## 2017-05-05 LAB — URINALYSIS, ROUTINE W REFLEX MICROSCOPIC
BILIRUBIN URINE: NEGATIVE
Glucose, UA: NEGATIVE mg/dL
HGB URINE DIPSTICK: NEGATIVE
Ketones, ur: NEGATIVE mg/dL
Leukocytes, UA: NEGATIVE
NITRITE: NEGATIVE
PROTEIN: NEGATIVE mg/dL
SPECIFIC GRAVITY, URINE: 1.01 (ref 1.005–1.030)
pH: 7 (ref 5.0–8.0)

## 2017-05-05 LAB — INFLUENZA PANEL BY PCR (TYPE A & B)
INFLBPCR: NEGATIVE
Influenza A By PCR: NEGATIVE

## 2017-05-05 MED ORDER — SALINE SPRAY 0.65 % NA SOLN: 1.0000 | NASAL | 0 refills | Status: DC | PRN

## 2017-05-05 MED ORDER — GUAIFENESIN ER 600 MG PO TB12: 600.0000 mg | ORAL_TABLET | Freq: Two times a day (BID) | ORAL | 1 refills | Status: DC

## 2017-05-05 MED ORDER — PSEUDOEPHEDRINE HCL 30 MG PO TABS: 30.0000 mg | ORAL_TABLET | ORAL | 0 refills | Status: DC | PRN

## 2017-05-05 NOTE — MAU Note (Signed)
Pt reports feeling bad since yesterday. States she has a lot of congestion, states she felt like she had a fever but did not check temp. Pt reports a sore throat, but denies cough. Pt states she took robatussin around 11-states it did help some. Pt also feels like she has not felt baby move as much. Pt denies pregnancy symptoms such as vag bleeding, lof or abdominal pain.

## 2017-05-05 NOTE — MAU Provider Note (Signed)
Chief Complaint:  URI   None    HPI: Julie Compton is a 27 y.o. G1P0 at 8419w6dwho presents to maternity admissions reporting nasal congestion.  No fever.  No chills.  States throat is not really  Sore.  No cough. . She reports good fetal movement, denies LOF, vaginal bleeding, vaginal itching/burning, urinary symptoms, h/a, dizziness, n/v, diarrhea, constipation or fever/chills.  She denies headache, visual changes or RUQ abdominal pain.  URI   This is a new problem. The current episode started yesterday. The problem has been unchanged. There has been no fever. Associated symptoms include congestion, rhinorrhea and sinus pain. Pertinent negatives include no abdominal pain, chest pain, coughing, diarrhea, dysuria, ear pain, headaches, nausea, sore throat or vomiting. She has tried nothing for the symptoms.    RN Note: Pt reports feeling bad since yesterday. States she has a lot of congestion, states she felt like she had a fever but did not check temp. Pt reports a sore throat, but denies cough. Pt states she took robatussin around 11-states it did help some. Pt also feels like she has not felt baby move as much. Pt denies pregnancy symptoms such as vag bleeding, lof or abdominal pain.     Past Medical History: Past Medical History:  Diagnosis Date  . Medical history non-contributory   . Palpitations     Past obstetric history: OB History  Gravida Para Term Preterm AB Living  1         0  SAB TAB Ectopic Multiple Live Births               # Outcome Date GA Lbr Len/2nd Weight Sex Delivery Anes PTL Lv  1 Current               Past Surgical History: Past Surgical History:  Procedure Laterality Date  . NO PAST SURGERIES      Family History: Family History  Problem Relation Age of Onset  . Diabetes Mother     Social History: Social History   Tobacco Use  . Smoking status: Never Smoker  . Smokeless tobacco: Never Used  Substance Use Topics  . Alcohol use: No  . Drug  use: No    Allergies: No Known Allergies  Meds:  Medications Prior to Admission  Medication Sig Dispense Refill Last Dose  . Prenatal Vit w/Fe-Methylfol-FA (PNV PO) Take by mouth.   05/05/2017 at Unknown time    I have reviewed patient's Past Medical Hx, Surgical Hx, Family Hx, Social Hx, medications and allergies.   ROS:  Review of Systems  HENT: Positive for congestion, rhinorrhea and sinus pain. Negative for ear pain and sore throat.   Respiratory: Negative for cough.   Cardiovascular: Negative for chest pain.  Gastrointestinal: Negative for abdominal pain, diarrhea, nausea and vomiting.  Genitourinary: Negative for dysuria.  Neurological: Negative for headaches.   Other systems negative  Physical Exam   Patient Vitals for the past 24 hrs:  BP Temp Temp src Pulse Resp SpO2 Height Weight  05/05/17 2031 115/69 98.1 F (36.7 C) Oral 88 18 100 % 5\' 6"  (1.676 m) 205 lb (93 kg)   Constitutional: Well-developed, well-nourished female in no acute distress.  HEENT:  Nasal congestion.  Throat noninflammed Cardiovascular: normal rate and rhythm Respiratory: normal effort, clear to auscultation bilaterally GI: Abd soft, non-tender, gravid appropriate for gestational age.   No rebound or guarding. MS: Extremities nontender, no edema, normal ROM Neurologic: Alert and oriented x 4.  GU: Neg  CVAT.  PELVIC EXAM:  deferred  FHT:  160  Labs: Results for orders placed or performed during the hospital encounter of 05/05/17 (from the past 24 hour(s))  Urinalysis, Routine w reflex microscopic     Status: None   Collection Time: 05/05/17  8:26 PM  Result Value Ref Range   Color, Urine YELLOW YELLOW   APPearance CLEAR CLEAR   Specific Gravity, Urine 1.010 1.005 - 1.030   pH 7.0 5.0 - 8.0   Glucose, UA NEGATIVE NEGATIVE mg/dL   Hgb urine dipstick NEGATIVE NEGATIVE   Bilirubin Urine NEGATIVE NEGATIVE   Ketones, ur NEGATIVE NEGATIVE mg/dL   Protein, ur NEGATIVE NEGATIVE mg/dL    Nitrite NEGATIVE NEGATIVE   Leukocytes, UA NEGATIVE NEGATIVE  Influenza panel by PCR (type A & B)     Status: None   Collection Time: 05/05/17  9:29 PM  Result Value Ref Range   Influenza A By PCR NEGATIVE NEGATIVE   Influenza B By PCR NEGATIVE NEGATIVE      Imaging:  No results found.  MAU Course/MDM: I have ordered labs and reviewed results. Flu test is negative Discussed URI and conservative care to include fluids, rest and meds.  Treatments in MAU included none.    Assessment: Nasal congestion Upper respiratory virus  Plan: Discharge home Rx limited sudafed for congestion Rx mucinex for thinning mucous Fluids and rest Follow up in Office for prenatal visits and recheck  Encouraged to return here or to other Urgent Care/ED if she develops worsening of symptoms, increase in pain, fever, or other concerning symptoms.   Pt stable at time of discharge.  Wynelle Bourgeois CNM, MSN Certified Nurse-Midwife 05/05/2017 10:38 PM

## 2017-05-05 NOTE — Discharge Instructions (Signed)
Upper Respiratory Infection, Adult Most upper respiratory infections (URIs) are a viral infection of the air passages leading to the lungs. A URI affects the nose, throat, and upper air passages. The most common type of URI is nasopharyngitis and is typically referred to as "the common cold." URIs run their course and usually go away on their own. Most of the time, a URI does not require medical attention, but sometimes a bacterial infection in the upper airways can follow a viral infection. This is called a secondary infection. Sinus and middle ear infections are common types of secondary upper respiratory infections. Bacterial pneumonia can also complicate a URI. A URI can worsen asthma and chronic obstructive pulmonary disease (COPD). Sometimes, these complications can require emergency medical care and may be life threatening. What are the causes? Almost all URIs are caused by viruses. A virus is a type of germ and can spread from one person to another. What increases the risk? You may be at risk for a URI if:  You smoke.  You have chronic heart or lung disease.  You have a weakened defense (immune) system.  You are very young or very old.  You have nasal allergies or asthma.  You work in crowded or poorly ventilated areas.  You work in health care facilities or schools.  What are the signs or symptoms? Symptoms typically develop 2-3 days after you come in contact with a cold virus. Most viral URIs last 7-10 days. However, viral URIs from the influenza virus (flu virus) can last 14-18 days and are typically more severe. Symptoms may include:  Runny or stuffy (congested) nose.  Sneezing.  Cough.  Sore throat.  Headache.  Fatigue.  Fever.  Loss of appetite.  Pain in your forehead, behind your eyes, and over your cheekbones (sinus pain).  Muscle aches.  How is this diagnosed? Your health care provider may diagnose a URI by:  Physical exam.  Tests to check that your  symptoms are not due to another condition such as: ? Strep throat. ? Sinusitis. ? Pneumonia. ? Asthma.  How is this treated? A URI goes away on its own with time. It cannot be cured with medicines, but medicines may be prescribed or recommended to relieve symptoms. Medicines may help:  Reduce your fever.  Reduce your cough.  Relieve nasal congestion.  Follow these instructions at home:  Take medicines only as directed by your health care provider.  Gargle warm saltwater or take cough drops to comfort your throat as directed by your health care provider.  Use a warm mist humidifier or inhale steam from a shower to increase air moisture. This may make it easier to breathe.  Drink enough fluid to keep your urine clear or pale yellow.  Eat soups and other clear broths and maintain good nutrition.  Rest as needed.  Return to work when your temperature has returned to normal or as your health care provider advises. You may need to stay home longer to avoid infecting others. You can also use a face mask and careful hand washing to prevent spread of the virus.  Increase the usage of your inhaler if you have asthma.  Do not use any tobacco products, including cigarettes, chewing tobacco, or electronic cigarettes. If you need help quitting, ask your health care provider. How is this prevented? The best way to protect yourself from getting a cold is to practice good hygiene.  Avoid oral or hand contact with people with cold symptoms.  Wash your  hands often if contact occurs.  There is no clear evidence that vitamin C, vitamin E, echinacea, or exercise reduces the chance of developing a cold. However, it is always recommended to get plenty of rest, exercise, and practice good nutrition. Contact a health care provider if:  You are getting worse rather than better.  Your symptoms are not controlled by medicine.  You have chills.  You have worsening shortness of breath.  You have  brown or red mucus.  You have yellow or brown nasal discharge.  You have pain in your face, especially when you bend forward.  You have a fever.  You have swollen neck glands.  You have pain while swallowing.  You have white areas in the back of your throat. Get help right away if:  You have severe or persistent: ? Headache. ? Ear pain. ? Sinus pain. ? Chest pain.  You have chronic lung disease and any of the following: ? Wheezing. ? Prolonged cough. ? Coughing up blood. ? A change in your usual mucus.  You have a stiff neck.  You have changes in your: ? Vision. ? Hearing. ? Thinking. ? Mood. This information is not intended to replace advice given to you by your health care provider. Make sure you discuss any questions you have with your health care provider. Document Released: 08/25/2000 Document Revised: 11/02/2015 Document Reviewed: 06/06/2013 Elsevier Interactive Patient Education  2018 [employer]  Prenatal Care Providers [facility]    [facility]  [phone]     Phone: [phone]          [facility]                      [facility]  @[email or web address]     Phone: [phone]  Phone: [phone]         [facility]     Phone: [phone]  Phone: [phone]           [facility] [place]                hone: [phone]  Phone: [phone]         [facility] [address]      Phone: [phone]  Phone: [phone]         [facility]                Phone: [phone]  [facility]   [address]Phone:[phone]    [facility]([place])          Phone: [phone] [facility]   Phone: [phone]

## 2017-07-07 LAB — OB RESULTS CONSOLE HGB/HCT, BLOOD
HCT: 32
Hemoglobin: 10.2

## 2017-07-07 LAB — OB RESULTS CONSOLE RPR: RPR: NONREACTIVE

## 2017-07-12 ENCOUNTER — Ambulatory Visit: Payer: Medicaid Other | Admitting: *Deleted

## 2017-07-12 ENCOUNTER — Telehealth: Payer: Self-pay | Admitting: General Practice

## 2017-07-12 ENCOUNTER — Other Ambulatory Visit: Payer: Self-pay | Admitting: *Deleted

## 2017-07-12 ENCOUNTER — Encounter: Payer: Medicaid Other | Attending: Obstetrics and Gynecology | Admitting: *Deleted

## 2017-07-12 DIAGNOSIS — R7302 Impaired glucose tolerance (oral): Secondary | ICD-10-CM | POA: Diagnosis not present

## 2017-07-12 DIAGNOSIS — O24410 Gestational diabetes mellitus in pregnancy, diet controlled: Secondary | ICD-10-CM

## 2017-07-12 DIAGNOSIS — Z713 Dietary counseling and surveillance: Secondary | ICD-10-CM | POA: Diagnosis not present

## 2017-07-12 MED ORDER — ACCU-CHEK FASTCLIX LANCETS MISC: 1.0000 | Freq: Four times a day (QID) | 12 refills | Status: DC

## 2017-07-12 MED ORDER — ACCU-CHEK GUIDE W/DEVICE KIT: 1.0000 | PACK | Freq: Once | 0 refills | Status: AC

## 2017-07-12 MED ORDER — GLUCOSE BLOOD VI STRP: 1.0000 | ORAL_STRIP | Freq: Four times a day (QID) | 12 refills | Status: DC

## 2017-07-12 NOTE — Telephone Encounter (Signed)
Patient called and left message on nurse voicemail line stating the pharmacy is saying her meter isn't covered by [facility] but she was told it would be. Called the pharmacy and they corrected the issue on their end and will get the Rx ready. Called & informed patient. Patient verbalized understanding & had no questions.

## 2017-07-12 NOTE — Progress Notes (Signed)
  Patient was seen on 07/12/2017 for Gestational Diabetes self-management. EDD 09/23/2017. Patient states no hisgtory of GDM. She states her mother has diabetes and she checked her BG over the weekend with her meter. Diet history obtained. 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 Beverages include water as well as some juice and regular sodas, which she plans to stop for duration of pregnancy.  The following learning objectives were met by the patient :   States the definition of Gestational Diabetes  States why dietary management is important in controlling blood glucose  Describes the effects of carbohydrates on blood glucose levels  Demonstrates ability to create a balanced meal plan  Demonstrates carbohydrate counting   States when to check blood glucose levels  Demonstrates proper blood glucose monitoring techniques  States the effect of stress and exercise on blood glucose levels  States the importance of limiting caffeine and abstaining from alcohol and smoking  Plan:  Aim for 3 Carb Choices per meal (45 grams) +/- 1 either way  Aim for 1-2 Carbs per snack Begin reading food labels for Total Carbohydrate of foods Consider  increasing your activity level by walking or other activity daily as tolerated Begin checking BG before breakfast and 2 hours after first bite of breakfast, lunch and dinner as directed by MD  Bring Log Book/Sheet to every medical appointment   Patient was introduced to [employer] and plans to use as record of BG electronically  Take medication if directed by MD  Blood glucose monitor Rx will be called into pharmacy: Accu Check Guide with Fast Clix drums Patient instructed to test pre breakfast and 2 hours each meal as directed by MD  Due to her 3rd shift work we decided her meals would be:  Fasting will be 2 PM when she wakes up 2 hours after first meal will be around  4 PM  Next meal will be 4 AM at work so she will test around 6 AM Last meal will be 8 AM on her way home so she will test right before she goes to sleep.this may be less than 2 hours so I explained that target is actually 140 mg/dl.  Patient instructed to monitor glucose levels: FBS: 60 - 95 mg/dl 2 hour: <120 mg/dl  Patient received the following handouts:  Nutrition Diabetes and Pregnancy  Carbohydrate Counting List  BG Log Sheet as back up to [employer]s  Patient will be seen for follow-up as needed.

## 2017-07-14 ENCOUNTER — Encounter: Payer: Self-pay | Admitting: *Deleted

## 2017-07-18 ENCOUNTER — Encounter: Payer: Self-pay | Admitting: *Deleted

## 2017-07-18 DIAGNOSIS — O0990 Supervision of high risk pregnancy, unspecified, unspecified trimester: Secondary | ICD-10-CM | POA: Insufficient documentation

## 2017-07-18 LAB — GLUCOSE, 1 HOUR: GLUCOSE, 1 HOUR-GESTATIONAL: 163

## 2017-07-18 LAB — CULTURE, OB URINE: URINE CULTURE, OB: NEGATIVE

## 2017-07-18 LAB — GLUCOSE, 3 HOUR GESTATIONAL

## 2017-07-18 LAB — AFP, QUAD SCREEN: AFP QUAD INTERP: NEGATIVE

## 2017-07-18 LAB — DRUG SCREEN, URINE: DRUG SCREEN, URINE: NEGATIVE

## 2017-07-18 LAB — CYTOLOGY - PAP: CYTOLOGY - PAP: NEGATIVE

## 2017-07-26 ENCOUNTER — Other Ambulatory Visit: Payer: Self-pay

## 2017-07-26 ENCOUNTER — Ambulatory Visit (INDEPENDENT_AMBULATORY_CARE_PROVIDER_SITE_OTHER): Payer: Medicaid Other | Admitting: Family Medicine

## 2017-07-26 ENCOUNTER — Encounter: Payer: Self-pay | Admitting: Family Medicine

## 2017-07-26 DIAGNOSIS — O0990 Supervision of high risk pregnancy, unspecified, unspecified trimester: Secondary | ICD-10-CM

## 2017-07-26 DIAGNOSIS — O24410 Gestational diabetes mellitus in pregnancy, diet controlled: Secondary | ICD-10-CM

## 2017-07-26 DIAGNOSIS — Z8632 Personal history of gestational diabetes: Secondary | ICD-10-CM

## 2017-07-26 HISTORY — DX: Personal history of gestational diabetes: Z86.32

## 2017-07-26 LAB — POCT URINALYSIS DIP (DEVICE)
Bilirubin Urine: NEGATIVE
GLUCOSE, UA: NEGATIVE mg/dL
Hgb urine dipstick: NEGATIVE
KETONES UR: 40 mg/dL — AB
Leukocytes, UA: NEGATIVE
NITRITE: NEGATIVE
PH: 6 (ref 5.0–8.0)
PROTEIN: NEGATIVE mg/dL
Specific Gravity, Urine: 1.02 (ref 1.005–1.030)
UROBILINOGEN UA: 0.2 mg/dL (ref 0.0–1.0)

## 2017-07-26 NOTE — Progress Notes (Signed)
Pt had spotting yesterday

## 2017-07-26 NOTE — Progress Notes (Signed)
   PRENATAL VISIT NOTE  Subjective:  Julie Compton is a 27 y.o. G1P0 at [redacted]w[redacted]d being seen today for transferring prenatal care. Found to have GDM at [facility] and now is transferring here. She is currently monitored for the following issues for this high-risk pregnancy and has Supervision of high risk pregnancy, antepartum on their problem list.  Patient reports no complaints.  Contractions: Not present. Vag. Bleeding: Scant.  Movement: Present. Denies leaking of fluid.   The following portions of the patient's history were reviewed and updated as appropriate: allergies, current medications, past family history, past medical history, past social history, past surgical history and problem list. Problem list updated.  Objective:   Vitals:   07/26/17 0851  BP: 114/79  Pulse: 98  Weight: 213 lb (96.6 kg)    Fetal Status: Fetal Heart Rate (bpm): 143 Fundal Height: 29 cm Movement: Present     General:  Alert, oriented and cooperative. Patient is in no acute distress.  Skin: Skin is warm and dry. No rash noted.   Cardiovascular: Normal heart rate noted  Respiratory: Normal respiratory effort, no problems with respiration noted  Abdomen: Soft, gravid, appropriate for gestational age.  Pain/Pressure: Present     Pelvic: Cervical exam deferred        Extremities: Normal range of motion.  Edema: None  Mental Status: Normal mood and affect. Normal behavior. Normal judgment and thought content.   Assessment and Plan:  Pregnancy: G1P0 at [redacted]w[redacted]d  1. Supervision of high risk pregnancy, antepartum Labs are up to date Asked about working--will discuss with HR and let us know what works best for her.  2. Diet controlled gestational diabetes mellitus (GDM) in third trimester Works third shift. Fastings are in an odd place On BabyScripts App 2 hr pp 80-198 (2 out of range due to diet) Continue diet Risks of diabetes in pregnancy and future impact discussed.  Preterm labor symptoms and general  obstetric precautions including but not limited to vaginal bleeding, contractions, leaking of fluid and fetal movement were reviewed in detail with the patient. Please refer to After Visit Summary for other counseling recommendations.  Return in 2 weeks (on 08/09/2017).  Future Appointments  Date Time Provider Department Center  08/15/2017  2:15 PM Levie Heritage, DO [facility]    Reva Bores, MD

## 2017-07-26 NOTE — Addendum Note (Signed)
Addended by: Reva Bores on: 07/26/2017 05:52 PM   Modules accepted: Level of Service

## 2017-07-26 NOTE — Patient Instructions (Signed)
Gestational Diabetes Mellitus, Diagnosis  Gestational diabetes (gestational diabetes mellitus) is a temporary form of diabetes that some women develop during pregnancy. It usually occurs around weeks 24-28 of pregnancy and goes away after delivery. Hormonal changes during pregnancy can interfere with insulin production and function, which may result in one or both of these problems:   The pancreas does not make enough of a hormone called insulin.   Cells in the body do not respond properly to insulin that the body makes (insulin resistance).    Normally, insulin allows sugars (glucose) to enter cells in the body. The cells use glucose for energy. Insulin resistance or lack of insulin causes excess glucose to build up in the blood instead of going into cells. As a result, high blood glucose (hyperglycemia) develops.  What are the risks?  If gestational diabetes is treated, it is unlikely to cause problems. If it is not controlled with treatment, it may cause problems during labor and delivery, and some of those problems can be harmful to the unborn baby (fetus) and the mother. Uncontrolled gestational diabetes may also cause the newborn baby to have breathing problems and low blood glucose.  Women who get gestational diabetes are more likely to develop it if they get pregnant again, and they are more likely to develop type 2 diabetes in the future.  What increases the risk?  This condition may be more likely to develop in pregnant women who:   Are older than age 25 during pregnancy.   Have a family history of diabetes.   Are overweight.   Had gestational diabetes in the past.   Have polycystic ovarian syndrome (PCOS).   Are pregnant with twins or multiples.   Are of American-Indian, African-American, Hispanic/Latino, or Asian/Pacific Islander descent.    What are the signs or symptoms?  Most women do not notice symptoms of gestational diabetes because the symptoms are similar to normal symptoms of pregnancy.  Symptoms of gestational diabetes may include:   Increased thirst (polydipsia).   Increased hunger(polyphagia).   Increased urination (polyuria).    How is this diagnosed?    This condition may be diagnosed based on your blood glucose level, which may be checked with one or more of the following blood tests:   A fasting blood glucose (FBG) test. You will not be allowed to eat (you will fast) for at least 8 hours before a blood sample is taken.   A random blood glucose test. This checks your blood glucose at any time of day regardless of when you ate.   An oral glucose tolerance test (OGTT). This is usually done during weeks 24-28 of pregnancy.  ? For this test, you will have an FBG test done. Then, you will drink a beverage that contains glucose. Your blood glucose will be tested again 1 hour after drinking the glucose beverage (1-hour OGTT).  ? If the 1-hour OGTT result is at or above 140 mg/dL (7.8 mmol/L), you will repeat the OGTT. This time, your blood glucose will be tested 3 hours after drinking the glucose beverage (3-hour OGTT).    If you have risk factors, you may be screened for undiagnosed type 2 diabetes at your first health care visit during your pregnancy (prenatal visit).  How is this treated?    Your treatment may be managed by a specialist called an endocrinologist. This condition is treated by following instructions from your health care provider about:   Eating a healthier diet and getting more physical activity.   are needed. ? If you use insulin, you may need to adjust your dosage based on how physically active you are and what foods you eat. Your health care provider will tell you how to do this.  Your health care provider will set treatment goals for you  based on the stage of your pregnancy and any other medical conditions you have. Generally, the goal of treatment is to maintain the following blood glucose levels during pregnancy:  Fasting: at or below 95 mg/dL (5.3 mmol/L).  After meals (postprandial): ? One hour after a meal: at or below 140 mg/dL (7.8 mmol/L). ? Two hours after a meal: at or below 120 mg/dL (6.7 mmol/L).  A1c (hemoglobin A1c) level: 6-6.5%.  Follow these instructions at home:  Take over-the-counter and prescription medicines only as told by your health care provider.  Manage your weight gain during pregnancy. The amount of weight that you are expected to gain depends on your pre-pregnancy BMI (body mass index).  Keep all follow-up visits as told by your health care provider. This is important. Consider asking your health care provider these questions:   Do I need to meet with a diabetes educator?  Where can I find a support group for people with diabetes?  What equipment will I need to manage my diabetes at home?  What diabetes medicines do I need, and when should I take them?  How often do I need to check my blood glucose?  What number can I call if I have questions?  When is my next appointment? Where to find more information:  For more information about diabetes, visit: ? American Diabetes Association (ADA): www.diabetes.org ? American Association of Diabetes Educators (AADE): www.diabeteseducator.org/patient-resources Contact a health care provider if:  Your blood glucose level is at or above 240 mg/dL (16.1 mmol/L).  Your blood glucose level is at or above 200 mg/dL (09.6 mmol/L) and you have ketones in your urine.  You have been sick or have had a fever for 2 days or more and you are not getting better.  You have any of the following problems for more than 6 hours: ? You cannot eat or drink. ? You have nausea and vomiting. ? You have diarrhea. Get help right away if:  Your blood glucose  is below 54 mg/dL (3 mmol/L).  You become confused or you have trouble thinking clearly.  You have difficulty breathing.  You have moderate or large ketone levels in your urine.  Your baby is moving around less than usual.  You develop unusual discharge or bleeding from your vagina.  You start having contractions early (prematurely). Contractions may feel like a tightening in your lower abdomen. This information is not intended to replace advice given to you by your health care provider. Make sure you discuss any questions you have with your health care provider. Document Released: 06/07/2000 Document Revised: 08/07/2015 Document Reviewed: 04/04/2015 Elsevier Interactive Patient Education  2018 [employer].   Breastfeeding Choosing to breastfeed is one of the best decisions you can make for yourself and your baby. A change in hormones during pregnancy causes your breasts to make breast milk in your milk-producing glands. Hormones prevent breast milk from being released before your baby is born. They also prompt milk flow after birth. Once breastfeeding has begun, thoughts of your baby, as well as his or her sucking or crying, can stimulate the release of milk from your milk-producing glands. Benefits of breastfeeding Research shows that breastfeeding offers many health benefits for  infants and mothers. It also offers a cost-free and convenient way to feed your baby. For your baby  Your first milk (colostrum) helps your baby's digestive system to function better.  Special cells in your milk (antibodies) help your baby to fight off infections.  Breastfed babies are less likely to develop asthma, allergies, obesity, or type 2 diabetes. They are also at lower risk for sudden infant death syndrome (SIDS).  Nutrients in breast milk are better able to meet your baby's needs compared to infant formula.  Breast milk improves your baby's brain development. For you  Breastfeeding helps to  create a very special bond between you and your baby.  Breastfeeding is convenient. Breast milk costs nothing and is always available at the correct temperature.  Breastfeeding helps to burn calories. It helps you to lose the weight that you gained during pregnancy.  Breastfeeding makes your uterus return faster to its size before pregnancy. It also slows bleeding (lochia) after you give birth.  Breastfeeding helps to lower your risk of developing type 2 diabetes, osteoporosis, rheumatoid arthritis, cardiovascular disease, and breast, ovarian, uterine, and endometrial cancer later in life. Breastfeeding basics Starting breastfeeding  Find a comfortable place to sit or lie down, with your neck and back well-supported.  Place a pillow or a rolled-up blanket under your baby to bring him or her to the level of your breast (if you are seated). Nursing pillows are specially designed to help support your arms and your baby while you breastfeed.  Make sure that your baby's tummy (abdomen) is facing your abdomen.  Gently massage your breast. With your fingertips, massage from the outer edges of your breast inward toward the nipple. This encourages milk flow. If your milk flows slowly, you may need to continue this action during the feeding.  Support your breast with 4 fingers underneath and your thumb above your nipple (make the letter "C" with your hand). Make sure your fingers are well away from your nipple and your baby's mouth.  Stroke your baby's lips gently with your finger or nipple.  When your baby's mouth is open wide enough, quickly bring your baby to your breast, placing your entire nipple and as much of the areola as possible into your baby's mouth. The areola is the colored area around your nipple. ? More areola should be visible above your baby's upper lip than below the lower lip. ? Your baby's lips should be opened and extended outward (flanged) to ensure an adequate, comfortable  latch. ? Your baby's tongue should be between his or her lower gum and your breast.  Make sure that your baby's mouth is correctly positioned around your nipple (latched). Your baby's lips should create a seal on your breast and be turned out (everted).  It is common for your baby to suck about 2-3 minutes in order to start the flow of breast milk. Latching Teaching your baby how to latch onto your breast properly is very important. An improper latch can cause nipple pain, decreased milk supply, and poor weight gain in your baby. Also, if your baby is not latched onto your nipple properly, he or she may swallow some air during feeding. This can make your baby fussy. Burping your baby when you switch breasts during the feeding can help to get rid of the air. However, teaching your baby to latch on properly is still the best way to prevent fussiness from swallowing air while breastfeeding. Signs that your baby has successfully latched onto your  nipple  Silent tugging or silent sucking, without causing you pain. Infant's lips should be extended outward (flanged).  Swallowing heard between every 3-4 sucks once your milk has started to flow (after your let-down milk reflex occurs).  Muscle movement above and in front of his or her ears while sucking.  Signs that your baby has not successfully latched onto your nipple  Sucking sounds or smacking sounds from your baby while breastfeeding.  Nipple pain.  If you think your baby has not latched on correctly, slip your finger into the corner of your baby's mouth to break the suction and place it between your baby's gums. Attempt to start breastfeeding again. Signs of successful breastfeeding Signs from your baby  Your baby will gradually decrease the number of sucks or will completely stop sucking.  Your baby will fall asleep.  Your baby's body will relax.  Your baby will retain a small amount of milk in his or her mouth.  Your baby will let  go of your breast by himself or herself.  Signs from you  Breasts that have increased in firmness, weight, and size 1-3 hours after feeding.  Breasts that are softer immediately after breastfeeding.  Increased milk volume, as well as a change in milk consistency and color by the fifth day of breastfeeding.  Nipples that are not sore, cracked, or bleeding.  Signs that your baby is getting enough milk  Wetting at least 1-2 diapers during the first 24 hours after birth.  Wetting at least 5-6 diapers every 24 hours for the first week after birth. The urine should be clear or pale yellow by the age of 5 days.  Wetting 6-8 diapers every 24 hours as your baby continues to grow and develop.  At least 3 stools in a 24-hour period by the age of 5 days. The stool should be soft and yellow.  At least 3 stools in a 24-hour period by the age of 7 days. The stool should be seedy and yellow.  No loss of weight greater than 10% of birth weight during the first 3 days of life.  Average weight gain of 4-7 oz (113-198 g) per week after the age of 4 days.  Consistent daily weight gain by the age of 5 days, without weight loss after the age of 2 weeks. After a feeding, your baby may spit up a small amount of milk. This is normal. Breastfeeding frequency and duration Frequent feeding will help you make more milk and can prevent sore nipples and extremely full breasts (breast engorgement). Breastfeed when you feel the need to reduce the fullness of your breasts or when your baby shows signs of hunger. This is called "breastfeeding on demand." Signs that your baby is hungry include:  Increased alertness, activity, or restlessness.  Movement of the head from side to side.  Opening of the mouth when the corner of the mouth or cheek is stroked (rooting).  Increased sucking sounds, smacking lips, cooing, sighing, or squeaking.  Hand-to-mouth movements and sucking on fingers or hands.  Fussing or  crying.  Avoid introducing a pacifier to your baby in the first 4-6 weeks after your baby is born. After this time, you may choose to use a pacifier. Research has shown that pacifier use during the first year of a baby's life decreases the risk of sudden infant death syndrome (SIDS). Allow your baby to feed on each breast as long as he or she wants. When your baby unlatches or falls asleep  while feeding from the first breast, offer the second breast. Because newborns are often sleepy in the first few weeks of life, you may need to awaken your baby to get him or her to feed. Breastfeeding times will vary from baby to baby. However, the following rules can serve as a guide to help you make sure that your baby is properly fed:  Newborns (babies 39 weeks of age or younger) may breastfeed every 1-3 hours.  Newborns should not go without breastfeeding for longer than 3 hours during the day or 5 hours during the night.  You should breastfeed your baby a minimum of 8 times in a 24-hour period.  Breast milk pumping Pumping and storing breast milk allows you to make sure that your baby is exclusively fed your breast milk, even at times when you are unable to breastfeed. This is especially important if you go back to work while you are still breastfeeding, or if you are not able to be present during feedings. Your lactation consultant can help you find a method of pumping that works best for you and give you guidelines about how long it is safe to store breast milk. Caring for your breasts while you breastfeed Nipples can become dry, cracked, and sore while breastfeeding. The following recommendations can help keep your breasts moisturized and healthy:  Avoid using soap on your nipples.  Wear a supportive bra designed especially for nursing. Avoid wearing underwire-style bras or extremely tight bras (sports bras).  Air-dry your nipples for 3-4 minutes after each feeding.  Use only cotton bra pads to  absorb leaked breast milk. Leaking of breast milk between feedings is normal.  Use lanolin on your nipples after breastfeeding. Lanolin helps to maintain your skin's normal moisture barrier. Pure lanolin is not harmful (not toxic) to your baby. You may also hand express a few drops of breast milk and gently massage that milk into your nipples and allow the milk to air-dry.  In the first few weeks after giving birth, some women experience breast engorgement. Engorgement can make your breasts feel heavy, warm, and tender to the touch. Engorgement peaks within 3-5 days after you give birth. The following recommendations can help to ease engorgement:  Completely empty your breasts while breastfeeding or pumping. You may want to start by applying warm, moist heat (in the shower or with warm, water-soaked hand towels) just before feeding or pumping. This increases circulation and helps the milk flow. If your baby does not completely empty your breasts while breastfeeding, pump any extra milk after he or she is finished.  Apply ice packs to your breasts immediately after breastfeeding or pumping, unless this is too uncomfortable for you. To do this: ? Put ice in a plastic bag. ? Place a towel between your skin and the bag. ? Leave the ice on for 20 minutes, 2-3 times a day.  Make sure that your baby is latched on and positioned properly while breastfeeding.  If engorgement persists after 48 hours of following these recommendations, contact your health care provider or a Advertising copywriter. Overall health care recommendations while breastfeeding  Eat 3 healthy meals and 3 snacks every day. Well-nourished mothers who are breastfeeding need an additional 450-500 calories a day. You can meet this requirement by increasing the amount of a balanced diet that you eat.  Drink enough water to keep your urine pale yellow or clear.  Rest often, relax, and continue to take your prenatal vitamins to prevent  fatigue, stress,  and low vitamin and mineral levels in your body (nutrient deficiencies).  Do not use any products that contain nicotine or tobacco, such as cigarettes and e-cigarettes. Your baby may be harmed by chemicals from cigarettes that pass into breast milk and exposure to secondhand smoke. If you need help quitting, ask your health care provider.  Avoid alcohol.  Do not use illegal drugs or marijuana.  Talk with your health care provider before taking any medicines. These include over-the-counter and prescription medicines as well as vitamins and herbal supplements. Some medicines that may be harmful to your baby can pass through breast milk.  It is possible to become pregnant while breastfeeding. If birth control is desired, ask your health care provider about options that will be safe while breastfeeding your baby. Where to find more information: [employer]: www.llli.org Contact a health care provider if:  You feel like you want to stop breastfeeding or have become frustrated with breastfeeding.  Your nipples are cracked or bleeding.  Your breasts are red, tender, or warm.  You have: ? Painful breasts or nipples. ? A swollen area on either breast. ? A fever or chills. ? Nausea or vomiting. ? Drainage other than breast milk from your nipples.  Your breasts do not become full before feedings by the fifth day after you give birth.  You feel sad and depressed.  Your baby is: ? Too sleepy to eat well. ? Having trouble sleeping. ? More than 28 week old and wetting fewer than 6 diapers in a 24-hour period. ? Not gaining weight by 76 days of age.  Your baby has fewer than 3 stools in a 24-hour period.  Your baby's skin or the white parts of his or her eyes become yellow. Get help right away if:  Your baby is overly tired (lethargic) and does not want to wake up and feed.  Your baby develops an unexplained fever. Summary  Breastfeeding offers many  health benefits for infant and mothers.  Try to breastfeed your infant when he or she shows early signs of hunger.  Gently tickle or stroke your baby's lips with your finger or nipple to allow the baby to open his or her mouth. Bring the baby to your breast. Make sure that much of the areola is in your baby's mouth. Offer one side and burp the baby before you offer the other side.  Talk with your health care provider or lactation consultant if you have questions or you face problems as you breastfeed. This information is not intended to replace advice given to you by your health care provider. Make sure you discuss any questions you have with your health care provider. Document Released: 03/01/2005 Document Revised: 04/02/2016 Document Reviewed: 04/02/2016 Elsevier Interactive Patient Education  [employer].

## 2017-08-15 ENCOUNTER — Ambulatory Visit (INDEPENDENT_AMBULATORY_CARE_PROVIDER_SITE_OTHER): Payer: Medicaid Other | Admitting: Family Medicine

## 2017-08-15 VITALS — BP 113/70 | HR 99 | Wt 212.0 lb

## 2017-08-15 DIAGNOSIS — O24410 Gestational diabetes mellitus in pregnancy, diet controlled: Secondary | ICD-10-CM

## 2017-08-15 DIAGNOSIS — O0993 Supervision of high risk pregnancy, unspecified, third trimester: Secondary | ICD-10-CM

## 2017-08-15 DIAGNOSIS — O0990 Supervision of high risk pregnancy, unspecified, unspecified trimester: Secondary | ICD-10-CM

## 2017-08-15 DIAGNOSIS — O2686 Pruritic urticarial papules and plaques of pregnancy (PUPPP): Secondary | ICD-10-CM

## 2017-08-15 NOTE — Patient Instructions (Signed)

## 2017-08-15 NOTE — Progress Notes (Signed)
Subjective:  Julie Compton is a 27 y.o. G1P0 at 1869w3d being seen today for ongoing prenatal care.  She is currently monitored for the following issues for this high-risk pregnancy and has Supervision of high risk pregnancy, antepartum and Gestational diabetes on their problem list.  GDM: Patient diet controlled.  Reports no hypoglycemic episodes. Fasting: not checking 2hr PP: 3 elevated. Has several missed values   Patient reports PEP.  Contractions: Not present. Vag. Bleeding: None.  Movement: Present. Denies leaking of fluid.   The following portions of the patient's history were reviewed and updated as appropriate: allergies, current medications, past family history, past medical history, past social history, past surgical history and problem list. Problem list updated.  Objective:   Vitals:   08/15/17 1435  BP: 113/70  Pulse: 99  Weight: 212 lb (96.2 kg)    Fetal Status: Fetal Heart Rate (bpm): 145   Movement: Present     General:  Alert, oriented and cooperative. Patient is in no acute distress.  Skin: Skin is warm and dry. No rash noted.   Cardiovascular: Normal heart rate noted  Respiratory: Normal respiratory effort, no problems with respiration noted  Abdomen: Soft, gravid, appropriate for gestational age. Pain/Pressure: Absent     Pelvic: Vag. Bleeding: None     Cervical exam deferred        Extremities: Normal range of motion.  Edema: None  Mental Status: Normal mood and affect. Normal behavior. Normal judgment and thought content.   Urinalysis:      Assessment and Plan:  Pregnancy: G1P0 at 6869w3d  1. Supervision of high risk pregnancy, antepartum FHT and FH normal. GBS next appt  2. Diet controlled gestational diabetes mellitus (GDM) in third trimester Discussed checking fasting before 4am meal.  Continue to check CBGs  3. PUPP (pruritic urticarial papules and plaques of pregnancy) Symptomatic releif   Preterm labor symptoms and general obstetric  precautions including but not limited to vaginal bleeding, contractions, leaking of fluid and fetal movement were reviewed in detail with the patient. Please refer to After Visit Summary for other counseling recommendations.  No follow-ups on file.   Levie HeritageStinson, Jacob J, DO

## 2017-08-31 ENCOUNTER — Other Ambulatory Visit (HOSPITAL_COMMUNITY)
Admission: RE | Admit: 2017-08-31 | Discharge: 2017-08-31 | Disposition: A | Discharge status: Home / Self Care | Payer: Medicaid Other | Source: Ambulatory Visit | Attending: Obstetrics & Gynecology | Admitting: Obstetrics & Gynecology

## 2017-08-31 ENCOUNTER — Ambulatory Visit (INDEPENDENT_AMBULATORY_CARE_PROVIDER_SITE_OTHER): Payer: Medicaid Other | Admitting: Obstetrics & Gynecology

## 2017-08-31 VITALS — BP 117/79 | HR 85 | Wt 212.3 lb

## 2017-08-31 DIAGNOSIS — O0990 Supervision of high risk pregnancy, unspecified, unspecified trimester: Secondary | ICD-10-CM | POA: Diagnosis present

## 2017-08-31 DIAGNOSIS — O98313 Other infections with a predominantly sexual mode of transmission complicating pregnancy, third trimester: Secondary | ICD-10-CM

## 2017-08-31 DIAGNOSIS — O0993 Supervision of high risk pregnancy, unspecified, third trimester: Secondary | ICD-10-CM | POA: Insufficient documentation

## 2017-08-31 DIAGNOSIS — O24410 Gestational diabetes mellitus in pregnancy, diet controlled: Secondary | ICD-10-CM

## 2017-08-31 DIAGNOSIS — Z3A36 36 weeks gestation of pregnancy: Secondary | ICD-10-CM | POA: Insufficient documentation

## 2017-08-31 DIAGNOSIS — A6009 Herpesviral infection of other urogenital tract: Secondary | ICD-10-CM | POA: Insufficient documentation

## 2017-08-31 DIAGNOSIS — O98319 Other infections with a predominantly sexual mode of transmission complicating pregnancy, unspecified trimester: Secondary | ICD-10-CM

## 2017-08-31 LAB — POCT URINALYSIS DIP (DEVICE)
BILIRUBIN URINE: NEGATIVE
Glucose, UA: NEGATIVE mg/dL
HGB URINE DIPSTICK: NEGATIVE
Ketones, ur: 40 mg/dL — AB
LEUKOCYTES UA: NEGATIVE
Nitrite: NEGATIVE
Protein, ur: NEGATIVE mg/dL
SPECIFIC GRAVITY, URINE: 1.02 (ref 1.005–1.030)
UROBILINOGEN UA: 0.2 mg/dL (ref 0.0–1.0)
pH: 7 (ref 5.0–8.0)

## 2017-08-31 LAB — OB RESULTS CONSOLE GC/CHLAMYDIA: GC PROBE AMP, GENITAL: NEGATIVE

## 2017-08-31 LAB — OB RESULTS CONSOLE GBS: STREP GROUP B AG: NEGATIVE

## 2017-08-31 MED ORDER — VALACYCLOVIR HCL 1 G PO TABS: 1000.0000 mg | ORAL_TABLET | Freq: Every day | ORAL | 0 refills | Status: AC

## 2017-08-31 NOTE — Progress Notes (Signed)
Patient and/or legal guardian verbally declined to meet with Behavioral Health Clinician about elevated phq9 and gad 7.

## 2017-08-31 NOTE — Progress Notes (Signed)
Addendum: cbgs:

## 2017-08-31 NOTE — Progress Notes (Signed)
valtre   PRENATAL VISIT NOTE  Subjective:  Julie Compton is a 27 y.o. G1P0 at 6530w5d being seen today for ongoing prenatal care.  She is currently monitored for the following issues for this high-risk pregnancy and has Supervision of high risk pregnancy, antepartum; Gestational diabetes; and Genital herpes complicating pregnancy, antepartum on their problem list.  Patient reports no complaints.  Contractions: Not present. Vag. Bleeding: None.  Movement: Present. Denies leaking of fluid.   The following portions of the patient's history were reviewed and updated as appropriate: allergies, current medications, past family history, past medical history, past social history, past surgical history and problem list. Problem list updated.  Objective:   Vitals:   08/31/17 1357  BP: 117/79  Pulse: 85  Weight: 212 lb 4.8 oz (96.3 kg)    Fetal Status: Fetal Heart Rate (bpm): 136 Fundal Height: 35 cm Movement: Present     General:  Alert, oriented and cooperative. Patient is in no acute distress.  Skin: Skin is warm and dry. No rash noted.   Cardiovascular: Normal heart rate noted  Respiratory: Normal respiratory effort, no problems with respiration noted  Abdomen: Soft, gravid, appropriate for gestational age.  Pain/Pressure: Present     Pelvic: Cervical exam performed        Extremities: Normal range of motion.  Edema: Trace  Mental Status: Normal mood and affect. Normal behavior. Normal judgment and thought content.   Assessment and Plan:  Pregnancy: G1P0 at 5230w5d  1. Supervision of high risk pregnancy, antepartum Routine testing - Culture, beta strep (group b only) - Cervicovaginal ancillary only - US MFM OB DETAIL +14 WK; Future  2. Diet controlled gestational diabetes mellitus (GDM), antepartum FBS <90, few PP 150-179 - Culture, beta strep (group b only) - Cervicovaginal ancillary only - US MFM OB DETAIL +14 WK; Future  Preterm labor symptoms and general obstetric  precautions including but not limited to vaginal bleeding, contractions, leaking of fluid and fetal movement were reviewed in detail with the patient. Please refer to After Visit Summary for other counseling recommendations.  Return in about 1 week (around 09/07/2017).  Future Appointments  Date Time Provider Department Center  09/07/2017  2:15 PM [facility] US 4 [facility]    Scheryl DarterJames Tyce Delcid, MD

## 2017-08-31 NOTE — Patient Instructions (Signed)

## 2017-09-01 LAB — CERVICOVAGINAL ANCILLARY ONLY
Chlamydia: NEGATIVE
NEISSERIA GONORRHEA: NEGATIVE

## 2017-09-04 LAB — CULTURE, BETA STREP (GROUP B ONLY): Strep Gp B Culture: NEGATIVE

## 2017-09-07 ENCOUNTER — Ambulatory Visit (HOSPITAL_COMMUNITY)
Admission: RE | Admit: 2017-09-07 | Discharge: 2017-09-07 | Disposition: A | Discharge status: Home / Self Care | Payer: Medicaid Other | Source: Ambulatory Visit | Attending: Obstetrics & Gynecology | Admitting: Obstetrics & Gynecology

## 2017-09-07 ENCOUNTER — Encounter (HOSPITAL_COMMUNITY): Payer: Self-pay

## 2017-09-07 ENCOUNTER — Other Ambulatory Visit: Payer: Self-pay | Admitting: Obstetrics & Gynecology

## 2017-09-07 DIAGNOSIS — Z363 Encounter for antenatal screening for malformations: Secondary | ICD-10-CM | POA: Insufficient documentation

## 2017-09-07 DIAGNOSIS — O0993 Supervision of high risk pregnancy, unspecified, third trimester: Secondary | ICD-10-CM | POA: Diagnosis not present

## 2017-09-07 DIAGNOSIS — O0990 Supervision of high risk pregnancy, unspecified, unspecified trimester: Secondary | ICD-10-CM

## 2017-09-07 DIAGNOSIS — Z3A37 37 weeks gestation of pregnancy: Secondary | ICD-10-CM

## 2017-09-07 DIAGNOSIS — O24410 Gestational diabetes mellitus in pregnancy, diet controlled: Secondary | ICD-10-CM

## 2017-09-13 ENCOUNTER — Ambulatory Visit (INDEPENDENT_AMBULATORY_CARE_PROVIDER_SITE_OTHER): Payer: Medicaid Other | Admitting: Obstetrics and Gynecology

## 2017-09-13 VITALS — BP 107/78 | HR 92 | Wt 212.4 lb

## 2017-09-13 DIAGNOSIS — O98319 Other infections with a predominantly sexual mode of transmission complicating pregnancy, unspecified trimester: Secondary | ICD-10-CM

## 2017-09-13 DIAGNOSIS — O0990 Supervision of high risk pregnancy, unspecified, unspecified trimester: Secondary | ICD-10-CM

## 2017-09-13 DIAGNOSIS — A6009 Herpesviral infection of other urogenital tract: Secondary | ICD-10-CM

## 2017-09-13 DIAGNOSIS — O24410 Gestational diabetes mellitus in pregnancy, diet controlled: Secondary | ICD-10-CM

## 2017-09-13 LAB — POCT URINALYSIS DIP (DEVICE)
BILIRUBIN URINE: NEGATIVE
Glucose, UA: 500 mg/dL — AB
Hgb urine dipstick: NEGATIVE
Ketones, ur: NEGATIVE mg/dL
LEUKOCYTES UA: NEGATIVE
NITRITE: NEGATIVE
PH: 7 (ref 5.0–8.0)
Protein, ur: NEGATIVE mg/dL
Specific Gravity, Urine: 1.015 (ref 1.005–1.030)
UROBILINOGEN UA: 0.2 mg/dL (ref 0.0–1.0)

## 2017-09-13 NOTE — Patient Instructions (Addendum)
Skin to Skin After delivery, the staff will place your baby on your chest. This helps with the following: . Regulates baby's temperature, breathing, heart rate and blood sugar . Increases Mom's milk supply . Promotes bonding . Keeps baby and Mom calm and decreases baby's crying   AREA PEDIATRIC/FAMILY PRACTICE PHYSICIANS  [facility] [address] [place], [place]  [address] Phone - [phone]   Fax - [phone]  [facility] [address] [place], [place] [address] Phone - [phone]   Fax - [phone]  [facility] [address] [place], [place]  [address] Phone - [phone]   Fax - [phone]  [facility] [address] [place], [place]  [address] Phone - [phone]   Fax - [phone]  [facility] [address] [place], [place]  [address] Phone - [phone]   Fax - [phone]  [facility] [address] [place], [place]  [address] Phone - [phone]   Fax - [phone]  [facility] [address] [place], [place]  [address] Phone - [phone]   Fax - [phone]  [facility] [address] [place], [place]  [address] Phone - [phone]   Fax - [phone]  [facility] AT [facility] [address] [place], [place]  [address] Phone - [phone]   Fax - [phone] [facility] [place] [address] [place], [place]  [address] Phone - [phone]   Fax - [phone]  [facility] [address] [place], [place]  [address] Phone - [phone]   Fax - [phone]  [facility] [address], [address] [place], [place]  [address] Phone - [phone]   Fax - [phone]  [facility] [address] [place], [place]  [address] Phone  - [phone]   Fax - [phone]  [facility] [address] [place], [place]  [address] Phone - [phone]  [facility] [address] [place], [place]  [address] Phone - [phone]   Fax - [phone]  [facility] [address] [place], [place]  [address] Phone - [phone]   Fax - [phone]  [facility] [address] [place], [place]  [address] Phone - [phone]   Fax - [phone]  [facility] [address] [place], [place]  [address] Phone - [phone]   Fax - [phone]   [facility] [address] [place], [place]  [address] Phone - [phone]   Fax - [phone]  [facility] [address] [place], [place]  [address] Phone - [phone]   Fax - [phone]  DAVID RUBIN [address] [place], [place]  [address] Phone - [phone]   Fax - [phone]  [facility] [address] [place], [place]  [address] Phone - [phone]   Fax - [phone]  [facility] [address] [place], [place]  [address] Phone - [phone]   Fax - [phone] Gerarda Fraction [address] [place], [place]  [address] Phone - [phone]   Fax - [phone]  [facility] [address] [place], [place]  [address] Phone - [phone]   Fax - [phone]  [facility] [place] [address] [place], [place]  [address] Phone - [phone]   Fax - [phone]  [facility] [place] Wyvonne Lenzharlene Flemming MD [address] [place] [address] Phone [phone]  Fax [phone]   Contraception Choices Contraception, also called birth control, means things to use or ways to try not to get pregnant. Hormonal birth control This kind of birth control uses hormones. Here are some types of  hormonal birth control:  A tube that is put under skin of the arm (implant). The tube can stay in for as long as 3 years.  Shots to get every 3 months (injections).  Pills to take every day (birth control pills).  A patch to change 1 time each week for 3 weeks (birth control patch). After that, the patch is taken off for 1 week.  A ring to put in the vagina. The ring is left in for 3 weeks. Then it is taken out of the vagina for 1 week. Then a new ring is put in.  Pills to take after unprotected sex (emergency birth control pills).  Barrier birth control Here are some types of barrier birth control:  A thin covering that is put on the penis before sex (female condom). The covering is thrown away after sex.  A soft, loose covering that is put in the vagina before sex (female condom). The covering is thrown away after sex.  A rubber bowl that sits over the cervix (diaphragm). The bowl must be made for you. The bowl is put into the vagina before sex. The bowl is left in for 6-8 hours after sex. It is taken out within 24 hours.  A small, soft cup that fits over the cervix (cervical cap). The cup must be made for you. The cup can be left in for 6-8 hours after sex. It is taken out within 48 hours.  A sponge that is put into the vagina before sex. It must be left in for at least 6 hours after sex. It must be taken out within 30 hours. Then it is thrown away.  A chemical that kills or stops sperm from getting into the uterus (spermicide). It may be a pill, cream, jelly, or foam to put in the vagina. The chemical should be used at least 10-15 minutes before sex.  IUD (intrauterine) birth control An IUD is a small, T-shaped piece of plastic. It is put inside the uterus. There are two kinds:  Hormone IUD. This kind can stay in for 3-5 years.  Copper IUD. This kind can stay in for 10 years.  Permanent birth control Here are some types of permanent birth control:  Surgery to block the  fallopian tubes.  Having an insert put into each fallopian tube.  Surgery to tie off the tubes that carry sperm (vasectomy).  Natural planning birth control Here are some types of natural planning birth control:  Not having sex on the days the woman could get pregnant.  Using a calendar: ? To keep track of the length of each period. ? To find out what days pregnancy can happen. ? To plan to not have sex on days when pregnancy can happen.  Watching for symptoms of ovulation and not having sex during ovulation. One way the woman can check for ovulation is to check her temperature.  Waiting to have sex until after ovulation.  Summary  Contraception, also called birth control, means things to use or ways to try not to get pregnant.  Hormonal methods of birth control  include implants, injections, pills, patches, vaginal rings, and emergency birth control pills.  Barrier methods of birth control can include female condoms, female condoms, diaphragms, cervical caps, sponges, and spermicides.  There are two types of IUD (intrauterine device) birth control. An IUD can be put in a woman's uterus to prevent pregnancy for 3-5 years.  Permanent sterilization can be done through a procedure for males, females, or both.  Natural planning methods involve not having sex on the days when the woman could get pregnant. This information is not intended to replace advice given to you by your health care provider. Make sure you discuss any questions you have with your health care provider. Document Released: 12/27/2008 Document Revised: 03/11/2016 Document Reviewed: 03/11/2016 Elsevier Interactive Patient Education  2017 [employer].

## 2017-09-13 NOTE — Progress Notes (Signed)
Subjective:  Julie Compton is a 27 y.o. G1P0 at 5227w4d being seen today for ongoing prenatal care.  She is currently monitored for the following issues for this high-risk pregnancy and has Supervision of high risk pregnancy, antepartum; Gestational diabetes; Genital herpes complicating pregnancy, antepartum; [redacted] weeks gestation of pregnancy; and Encounter for antenatal screening for malformation on their problem list.  Patient reports no complaints.  Contractions: Not present. Vag. Bleeding: None.  Movement: Present. Denies leaking of fluid.   The following portions of the patient's history were reviewed and updated as appropriate: allergies, current medications, past family history, past medical history, past social history, past surgical history and problem list. Problem list updated.  Objective:   Vitals:   09/13/17 1547  BP: 107/78  Pulse: 92  Weight: 96.3 kg (212 lb 6.4 oz)    Fetal Status: Fetal Heart Rate (bpm): 153 Fundal Height: 36 cm Movement: Present     General:  Alert, oriented and cooperative. Patient is in no acute distress.  Skin: Skin is warm and dry. No rash noted.   Cardiovascular: Normal heart rate noted  Respiratory: Normal respiratory effort, no problems with respiration noted  Abdomen: Soft, gravid, appropriate for gestational age. Pain/Pressure: Present     Pelvic: Vag. Bleeding: None     Cervical exam deferred        Extremities: Normal range of motion.  Edema: None  Mental Status: Normal mood and affect. Normal behavior. Normal judgment and thought content.   Urinalysis: Urine Protein: Negative Urine Glucose: Negative  Assessment and Plan:  Pregnancy: G1P0 at 3127w4d  1. Supervision of high risk pregnancy, antepartum Routine care. Doing well.   2. Diet controlled gestational diabetes mellitus (GDM), antepartum CBGs overall well controlled. No need for medication at this time. Delivery at 40 wks. Had growth US last week with EFW 38%ile.  3. Genital  herpes complicating pregnancy, antepartum Not taking her Valtrex. Encouraged patient to use. Discussed purpose of medication.   Term labor symptoms and general obstetric precautions including but not limited to vaginal bleeding, contractions, leaking of fluid and fetal movement were reviewed in detail with the patient. Please refer to After Visit Summary for other counseling recommendations.  Return in about 1 week (around 09/20/2017) for ob visit.   Pincus LargePhelps, Jazma Y, DO

## 2017-09-19 NOTE — Progress Notes (Signed)
FASTING BREAKFAST LUNCH DINNER MON JUL 01  Show notes 88  94  106    TUE JUL 02  Show notes 90  101    175  WED JUL 03  Show notes 102  145  108    THU JUL 04  Show notes   97  109  111  FRI JUL 05  Show notes 100  120  95    SAT JUL 06  Show notes   100  93  157  SUN JUL 07  Show notes 85  98  103  180

## 2017-09-21 ENCOUNTER — Ambulatory Visit (INDEPENDENT_AMBULATORY_CARE_PROVIDER_SITE_OTHER): Payer: Medicaid Other | Admitting: Obstetrics and Gynecology

## 2017-09-21 ENCOUNTER — Telehealth (HOSPITAL_COMMUNITY): Payer: Self-pay | Admitting: *Deleted

## 2017-09-21 ENCOUNTER — Telehealth: Payer: Self-pay

## 2017-09-21 ENCOUNTER — Other Ambulatory Visit (HOSPITAL_COMMUNITY)
Admission: RE | Admit: 2017-09-21 | Discharge: 2017-09-21 | Disposition: A | Discharge status: Home / Self Care | Payer: Medicaid Other | Source: Ambulatory Visit | Attending: Obstetrics and Gynecology | Admitting: Obstetrics and Gynecology

## 2017-09-21 VITALS — BP 116/77 | HR 74 | Wt 215.3 lb

## 2017-09-21 DIAGNOSIS — O24410 Gestational diabetes mellitus in pregnancy, diet controlled: Secondary | ICD-10-CM | POA: Diagnosis not present

## 2017-09-21 DIAGNOSIS — O98313 Other infections with a predominantly sexual mode of transmission complicating pregnancy, third trimester: Secondary | ICD-10-CM | POA: Diagnosis not present

## 2017-09-21 DIAGNOSIS — N898 Other specified noninflammatory disorders of vagina: Secondary | ICD-10-CM

## 2017-09-21 DIAGNOSIS — A6009 Herpesviral infection of other urogenital tract: Secondary | ICD-10-CM | POA: Diagnosis not present

## 2017-09-21 DIAGNOSIS — O0993 Supervision of high risk pregnancy, unspecified, third trimester: Secondary | ICD-10-CM | POA: Diagnosis not present

## 2017-09-21 DIAGNOSIS — O98319 Other infections with a predominantly sexual mode of transmission complicating pregnancy, unspecified trimester: Secondary | ICD-10-CM

## 2017-09-21 DIAGNOSIS — O0990 Supervision of high risk pregnancy, unspecified, unspecified trimester: Secondary | ICD-10-CM

## 2017-09-21 MED ORDER — MICONAZOLE NITRATE 2 % VA CREA: 1.0000 | TOPICAL_CREAM | Freq: Every day | VAGINAL | 0 refills | Status: AC

## 2017-09-21 NOTE — Telephone Encounter (Signed)
Preadmission screen  

## 2017-09-21 NOTE — Progress Notes (Signed)
Subjective:  Julie Compton is a 27 y.o. G1P0 at 4213w5d being seen today for ongoing prenatal care.  She is currently monitored for the following issues for this high-risk pregnancy and has Supervision of high risk pregnancy, antepartum; Gestational diabetes; Genital herpes complicating pregnancy, antepartum; [redacted] weeks gestation of pregnancy; and Encounter for antenatal screening for malformation on their problem list.  Patient reports vaginal irritation. Believes she may have a yeast infection. Changed detergents.   Contractions: Not present. Vag. Bleeding: None.  Movement: Present. Denies leaking of fluid.   The following portions of the patient's history were reviewed and updated as appropriate: allergies, current medications, past family history, past medical history, past social history, past surgical history and problem list. Problem list updated.  Objective:   Vitals:   09/21/17 0829  BP: 116/77  Pulse: 74  Weight: 97.7 kg (215 lb 4.8 oz)    Fetal Status: Fetal Heart Rate (bpm): 138 Fundal Height: 37 cm Movement: Present  Presentation: Vertex  General:  Alert, oriented and cooperative. Patient is in no acute distress.  Skin: Skin is warm and dry. No rash noted.   Cardiovascular: Normal heart rate noted  Respiratory: Normal respiratory effort, no problems with respiration noted  Abdomen: Soft, gravid, appropriate for gestational age. Pain/Pressure: Present     Pelvic: Vag. Bleeding: None Vag D/C Character: White   Cervical exam performed Dilation: Fingertip Effacement (%): 30 Station: -3  Extremities: Normal range of motion.  Edema: None  Mental Status: Normal mood and affect. Normal behavior. Normal judgment and thought content.   Urinalysis:      Assessment and Plan:  Pregnancy: G1P0 at 3013w5d  1. Supervision of high risk pregnancy, antepartum Doing well. IOL scheduled for 40wks due to GDM.   2. Diet controlled gestational diabetes mellitus (GDM) in third trimester CBGs  uploaded from babyscripts. Has had a couple really high CBGs after eating dinner (highest 180). Patient endorses high carb meal at that time. Encouraged better control. No need for medication at this time. IOL scheduled for this week.  3. Genital herpes complicating pregnancy, antepartum Started taking Valtrex since last visit. No cocnern for outbreak.  4. Vagina itching Discharge consistent with yeast. Rx given for vaginal cream.  - Cervicovaginal ancillary only   Term labor symptoms and general obstetric precautions including but not limited to vaginal bleeding, contractions, leaking of fluid and fetal movement were reviewed in detail with the patient. Please refer to After Visit Summary for other counseling recommendations.  Return in about 6 weeks (around 11/02/2017) for postpartum visit.   Pincus LargePhelps, Royale Swamy Y, DO

## 2017-09-21 NOTE — Progress Notes (Signed)
Pt has itching but no d/c nor odor.

## 2017-09-21 NOTE — Telephone Encounter (Signed)
Called pt to advise of Induction date & time which is 09/24/17 @ 7:30a. Pt verbally acknowledged.

## 2017-09-21 NOTE — Patient Instructions (Signed)
Labor Induction Labor induction is when steps are taken to cause a pregnant woman to begin the labor process. Most women go into labor on their own between 37 weeks and 42 weeks of the pregnancy. When this does not happen or when there is a medical need, methods may be used to induce labor. Labor induction causes a pregnant woman's uterus to contract. It also causes the cervix to soften (ripen), open (dilate), and thin out (efface). Usually, labor is not induced before 39 weeks of the pregnancy unless there is a problem with the baby or mother. Before inducing labor, your health care provider will consider a number of factors, including the following:  The medical condition of you and the baby.  How many weeks along you are.  The status of the baby's lung maturity.  The condition of the cervix.  The position of the baby. What are the reasons for labor induction? Labor may be induced for the following reasons:  The health of the baby or mother is at risk.  The pregnancy is overdue by 1 week or more.  The water breaks but labor does not start on its own.  The mother has a health condition or serious illness, such as high blood pressure, infection, placental abruption, or diabetes.  The amniotic fluid amounts are low around the baby.  The baby is distressed. Convenience or wanting the baby to be born on a certain date is not a reason for inducing labor. What methods are used for labor induction? Several methods of labor induction may be used, such as:  Prostaglandin medicine. This medicine causes the cervix to dilate and ripen. The medicine will also start contractions. It can be taken by mouth or by inserting a suppository into the vagina.  Inserting a thin tube (catheter) with a balloon on the end into the vagina to dilate the cervix. Once inserted, the balloon is expanded with water, which causes the cervix to open.  Stripping the membranes. Your health care provider separates  amniotic sac tissue from the cervix, causing the cervix to be stretched and causing the release of a hormone called progesterone. This may cause the uterus to contract. It is often done during an office visit. You will be sent home to wait for the contractions to begin. You will then come in for an induction.  Breaking the water. Your health care provider makes a hole in the amniotic sac using a small instrument. Once the amniotic sac breaks, contractions should begin. This may still take hours to see an effect.  Medicine to trigger or strengthen contractions. This medicine is given through an IV access tube inserted into a vein in your arm. All of the methods of induction, besides stripping the membranes, will be done in the hospital. Induction is done in the hospital so that you and the baby can be carefully monitored. How long does it take for labor to be induced? Some inductions can take up to 2-3 days. Depending on the cervix, it usually takes less time. It takes longer when you are induced early in the pregnancy or if this is your first pregnancy. If a mother is still pregnant and the induction has been going on for 2-3 days, either the mother will be sent home or a cesarean delivery will be needed. What are the risks associated with labor induction? Some of the risks of induction include:  Changes in fetal heart rate, such as too high, too low, or erratic.  Fetal distress.    Chance of infection for the mother and baby.  Increased chance of having a cesarean delivery.  Breaking off (abruption) of the placenta from the uterus (rare).  Uterine rupture (very rare). When induction is needed for medical reasons, the benefits of induction may outweigh the risks. What are some reasons for not inducing labor? Labor induction should not be done if:  It is shown that your baby does not tolerate labor.  You have had previous surgeries on your uterus, such as a myomectomy or the removal of  fibroids.  Your placenta lies very low in the uterus and blocks the opening of the cervix (placenta previa).  Your baby is not in a head-down position.  The umbilical cord drops down into the birth canal in front of the baby. This could cut off the baby's blood and oxygen supply.  You have had a previous cesarean delivery.  There are unusual circumstances, such as the baby being extremely premature. This information is not intended to replace advice given to you by your health care provider. Make sure you discuss any questions you have with your health care provider. Document Released: 07/21/2006 Document Revised: 08/07/2015 Document Reviewed: 09/28/2012 Elsevier Interactive Patient Education  2017 [employer] Inc[employer]  

## 2017-09-22 ENCOUNTER — Telehealth: Payer: Self-pay | Admitting: *Deleted

## 2017-09-22 LAB — CERVICOVAGINAL ANCILLARY ONLY
BACTERIAL VAGINITIS: NEGATIVE
Candida vaginitis: POSITIVE — AB
Chlamydia: NEGATIVE
Neisseria Gonorrhea: NEGATIVE
Trichomonas: NEGATIVE

## 2017-09-22 NOTE — Telephone Encounter (Signed)
Patient left message on nurse voicemail on 09/21/17 at [phone].  States she had a missed call and thinks this is about her induction date.  Requests a return call to [phone].  Patient was called on 09/21/17 at 1207 by Corinda GublerPam Neal, CMA.  See note in chart.  Patient given date and time of induction.

## 2017-09-23 ENCOUNTER — Encounter: Payer: Self-pay | Admitting: *Deleted

## 2017-09-23 ENCOUNTER — Telehealth: Payer: Self-pay | Admitting: General Practice

## 2017-09-23 NOTE — Telephone Encounter (Signed)
Patient called and left message on nurse voicemail line stating she has questions about her IOL tomorrow. Called patient & she states she has HSV and has had an outbreak over the past week. Patient states she thinks it is getting better though. Told patient they will have to ultimately evaluate it tomorrow when she comes in but a c-section is a possibility. Patient verbalized understanding & had no questions.

## 2017-09-24 ENCOUNTER — Inpatient Hospital Stay (HOSPITAL_COMMUNITY): Payer: Medicaid Other | Admitting: Anesthesiology

## 2017-09-24 ENCOUNTER — Encounter (HOSPITAL_COMMUNITY): Payer: Self-pay

## 2017-09-24 ENCOUNTER — Encounter (HOSPITAL_COMMUNITY)
Admission: RE | Disposition: A | Discharge status: Home / Self Care | Payer: Self-pay | Source: Ambulatory Visit | Attending: Obstetrics & Gynecology

## 2017-09-24 ENCOUNTER — Inpatient Hospital Stay (HOSPITAL_COMMUNITY)
Admission: RE | Admit: 2017-09-24 | Discharge: 2017-09-26 | DRG: 788 | Disposition: A | Discharge status: Home / Self Care | Payer: Medicaid Other | Source: Ambulatory Visit | Attending: Obstetrics & Gynecology | Admitting: Obstetrics & Gynecology

## 2017-09-24 VITALS — BP 108/84 | HR 93 | Temp 98.4°F | Resp 18 | Ht 65.0 in | Wt 214.5 lb

## 2017-09-24 DIAGNOSIS — Z79899 Other long term (current) drug therapy: Secondary | ICD-10-CM

## 2017-09-24 DIAGNOSIS — O9832 Other infections with a predominantly sexual mode of transmission complicating childbirth: Principal | ICD-10-CM | POA: Diagnosis present

## 2017-09-24 DIAGNOSIS — O9852 Other viral diseases complicating childbirth: Secondary | ICD-10-CM

## 2017-09-24 DIAGNOSIS — O0990 Supervision of high risk pregnancy, unspecified, unspecified trimester: Secondary | ICD-10-CM

## 2017-09-24 DIAGNOSIS — Z8632 Personal history of gestational diabetes: Secondary | ICD-10-CM | POA: Diagnosis present

## 2017-09-24 DIAGNOSIS — O98319 Other infections with a predominantly sexual mode of transmission complicating pregnancy, unspecified trimester: Secondary | ICD-10-CM

## 2017-09-24 DIAGNOSIS — A6000 Herpesviral infection of urogenital system, unspecified: Secondary | ICD-10-CM | POA: Diagnosis present

## 2017-09-24 DIAGNOSIS — O24420 Gestational diabetes mellitus in childbirth, diet controlled: Secondary | ICD-10-CM | POA: Diagnosis present

## 2017-09-24 DIAGNOSIS — Z3A40 40 weeks gestation of pregnancy: Secondary | ICD-10-CM

## 2017-09-24 DIAGNOSIS — A6009 Herpesviral infection of other urogenital tract: Secondary | ICD-10-CM

## 2017-09-24 DIAGNOSIS — O2402 Pre-existing diabetes mellitus, type 1, in childbirth: Secondary | ICD-10-CM | POA: Diagnosis not present

## 2017-09-24 DIAGNOSIS — Z98891 History of uterine scar from previous surgery: Secondary | ICD-10-CM

## 2017-09-24 DIAGNOSIS — O24410 Gestational diabetes mellitus in pregnancy, diet controlled: Secondary | ICD-10-CM

## 2017-09-24 LAB — TYPE AND SCREEN
ABO/RH(D): B POS
Antibody Screen: NEGATIVE

## 2017-09-24 LAB — GLUCOSE, CAPILLARY: Glucose-Capillary: 77 mg/dL (ref 70–99)

## 2017-09-24 LAB — CBC
HEMATOCRIT: 31.5 % — AB (ref 36.0–46.0)
HEMOGLOBIN: 10.4 g/dL — AB (ref 12.0–15.0)
MCH: 26 pg (ref 26.0–34.0)
MCHC: 33 g/dL (ref 30.0–36.0)
MCV: 78.8 fL (ref 78.0–100.0)
Platelets: 323 10*3/uL (ref 150–400)
RBC: 4 MIL/uL (ref 3.87–5.11)
RDW: 14.7 % (ref 11.5–15.5)
WBC: 6.9 10*3/uL (ref 4.0–10.5)

## 2017-09-24 LAB — ABO/RH: ABO/RH(D): B POS

## 2017-09-24 SURGERY — Surgical Case
Anesthesia: Spinal

## 2017-09-24 MED ORDER — NALOXONE HCL 4 MG/10ML IJ SOLN
1.0000 ug/kg/h | INTRAVENOUS | Status: DC | PRN
  Filled 2017-09-24: qty 5

## 2017-09-24 MED ORDER — BUPIVACAINE HCL (PF) 0.5 % IJ SOLN
INTRAMUSCULAR | Status: DC | PRN
  Administered 2017-09-24: 30 mL

## 2017-09-24 MED ORDER — MEPERIDINE HCL 25 MG/ML IJ SOLN: 6.2500 mg | INTRAMUSCULAR | Status: DC | PRN

## 2017-09-24 MED ORDER — OXYCODONE-ACETAMINOPHEN 5-325 MG PO TABS: 2.0000 | ORAL_TABLET | ORAL | Status: DC | PRN

## 2017-09-24 MED ORDER — ONDANSETRON HCL 4 MG/2ML IJ SOLN: 4.0000 mg | Freq: Three times a day (TID) | INTRAMUSCULAR | Status: DC | PRN

## 2017-09-24 MED ORDER — COCONUT OIL OIL: 1.0000 "application " | TOPICAL_OIL | Status: DC | PRN

## 2017-09-24 MED ORDER — MORPHINE SULFATE (PF) 0.5 MG/ML IJ SOLN
INTRAMUSCULAR | Status: DC | PRN
  Administered 2017-09-24: .2 mg via INTRATHECAL
  Administered 2017-09-24: .3 mg via INTRAVENOUS

## 2017-09-24 MED ORDER — PRENATAL MULTIVITAMIN CH
1.0000 | ORAL_TABLET | Freq: Every day | ORAL | Status: DC
  Administered 2017-09-25 – 2017-09-26 (×2): 1 via ORAL
  Filled 2017-09-24 (×2): qty 1

## 2017-09-24 MED ORDER — DIPHENHYDRAMINE HCL 50 MG/ML IJ SOLN: 12.5000 mg | INTRAMUSCULAR | Status: DC | PRN

## 2017-09-24 MED ORDER — TETANUS-DIPHTH-ACELL PERTUSSIS 5-2.5-18.5 LF-MCG/0.5 IM SUSP: 0.5000 mL | Freq: Once | INTRAMUSCULAR | Status: DC

## 2017-09-24 MED ORDER — BUPIVACAINE IN DEXTROSE 0.75-8.25 % IT SOLN
INTRATHECAL | Status: DC | PRN
  Administered 2017-09-24: 1.6 mL via INTRATHECAL

## 2017-09-24 MED ORDER — MEASLES, MUMPS & RUBELLA VAC ~~LOC~~ INJ
0.5000 mL | INJECTION | Freq: Once | SUBCUTANEOUS | Status: DC
  Filled 2017-09-24: qty 0.5

## 2017-09-24 MED ORDER — ZOLPIDEM TARTRATE 5 MG PO TABS: 5.0000 mg | ORAL_TABLET | Freq: Every evening | ORAL | Status: DC | PRN

## 2017-09-24 MED ORDER — BUPIVACAINE HCL (PF) 0.5 % IJ SOLN
INTRAMUSCULAR | Status: AC
  Filled 2017-09-24: qty 30

## 2017-09-24 MED ORDER — ENOXAPARIN SODIUM 60 MG/0.6ML ~~LOC~~ SOLN
50.0000 mg | SUBCUTANEOUS | Status: DC
  Administered 2017-09-25 – 2017-09-26 (×2): 50 mg via SUBCUTANEOUS
  Filled 2017-09-24 (×3): qty 0.6

## 2017-09-24 MED ORDER — SCOPOLAMINE 1 MG/3DAYS TD PT72
MEDICATED_PATCH | TRANSDERMAL | Status: DC | PRN
  Administered 2017-09-24: 1 via TRANSDERMAL

## 2017-09-24 MED ORDER — ACETAMINOPHEN 10 MG/ML IV SOLN: 1000.0000 mg | Freq: Once | INTRAVENOUS | Status: DC | PRN

## 2017-09-24 MED ORDER — SODIUM CHLORIDE 0.9% FLUSH: 3.0000 mL | INTRAVENOUS | Status: DC | PRN

## 2017-09-24 MED ORDER — SENNOSIDES-DOCUSATE SODIUM 8.6-50 MG PO TABS
2.0000 | ORAL_TABLET | ORAL | Status: DC
  Administered 2017-09-25 – 2017-09-26 (×2): 2 via ORAL
  Filled 2017-09-24 (×2): qty 2

## 2017-09-24 MED ORDER — NALBUPHINE HCL 10 MG/ML IJ SOLN: 5.0000 mg | Freq: Once | INTRAMUSCULAR | Status: DC | PRN

## 2017-09-24 MED ORDER — NALOXONE HCL 0.4 MG/ML IJ SOLN: 0.4000 mg | INTRAMUSCULAR | Status: DC | PRN

## 2017-09-24 MED ORDER — SOD CITRATE-CITRIC ACID 500-334 MG/5ML PO SOLN
30.0000 mL | ORAL | Status: AC
  Administered 2017-09-24: 30 mL via ORAL
  Filled 2017-09-24: qty 15

## 2017-09-24 MED ORDER — DIBUCAINE 1 % RE OINT: 1.0000 "application " | TOPICAL_OINTMENT | RECTAL | Status: DC | PRN

## 2017-09-24 MED ORDER — FLUCONAZOLE 150 MG PO TABS
150.0000 mg | ORAL_TABLET | Freq: Once | ORAL | Status: AC
  Administered 2017-09-24: 150 mg via ORAL
  Filled 2017-09-24: qty 1

## 2017-09-24 MED ORDER — IBUPROFEN 600 MG PO TABS
600.0000 mg | ORAL_TABLET | Freq: Four times a day (QID) | ORAL | Status: DC
  Administered 2017-09-24 – 2017-09-26 (×8): 600 mg via ORAL
  Filled 2017-09-24 (×8): qty 1

## 2017-09-24 MED ORDER — LACTATED RINGERS IV SOLN
INTRAVENOUS | Status: DC
  Administered 2017-09-24: 23:00:00 via INTRAVENOUS

## 2017-09-24 MED ORDER — MAGNESIUM HYDROXIDE 400 MG/5ML PO SUSP: 30.0000 mL | ORAL | Status: DC | PRN

## 2017-09-24 MED ORDER — NALBUPHINE HCL 10 MG/ML IJ SOLN: 5.0000 mg | INTRAMUSCULAR | Status: DC | PRN

## 2017-09-24 MED ORDER — OXYCODONE-ACETAMINOPHEN 5-325 MG PO TABS
1.0000 | ORAL_TABLET | ORAL | Status: DC | PRN
  Administered 2017-09-25 – 2017-09-26 (×3): 1 via ORAL
  Filled 2017-09-24 (×3): qty 1

## 2017-09-24 MED ORDER — LACTATED RINGERS IV SOLN
INTRAVENOUS | Status: DC | PRN
  Administered 2017-09-24 (×2): via INTRAVENOUS

## 2017-09-24 MED ORDER — LACTATED RINGERS IV SOLN
INTRAVENOUS | Status: DC | PRN
  Administered 2017-09-24: 40 [IU] via INTRAVENOUS

## 2017-09-24 MED ORDER — SIMETHICONE 80 MG PO CHEW: 80.0000 mg | CHEWABLE_TABLET | ORAL | Status: DC | PRN

## 2017-09-24 MED ORDER — HYDROMORPHONE HCL 1 MG/ML IJ SOLN: 0.2500 mg | INTRAMUSCULAR | Status: DC | PRN

## 2017-09-24 MED ORDER — SODIUM CHLORIDE 0.9 % IV SOLN
2.0000 g | INTRAVENOUS | Status: AC
  Administered 2017-09-24: 2 g via INTRAVENOUS
  Filled 2017-09-24: qty 2

## 2017-09-24 MED ORDER — FERROUS SULFATE 325 (65 FE) MG PO TABS
325.0000 mg | ORAL_TABLET | Freq: Two times a day (BID) | ORAL | Status: DC
  Administered 2017-09-24 – 2017-09-25 (×2): 325 mg via ORAL
  Filled 2017-09-24 (×2): qty 1

## 2017-09-24 MED ORDER — OXYTOCIN 40 UNITS IN LACTATED RINGERS INFUSION - SIMPLE MED: 2.5000 [IU]/h | INTRAVENOUS | Status: AC

## 2017-09-24 MED ORDER — LACTATED RINGERS IV SOLN
INTRAVENOUS | Status: DC
  Administered 2017-09-24: 13:00:00 via INTRAVENOUS

## 2017-09-24 MED ORDER — MENTHOL 3 MG MT LOZG: 1.0000 | LOZENGE | OROMUCOSAL | Status: DC | PRN

## 2017-09-24 MED ORDER — DEXAMETHASONE SODIUM PHOSPHATE 10 MG/ML IJ SOLN
INTRAMUSCULAR | Status: DC | PRN
  Administered 2017-09-24: 10 mg via INTRAVENOUS

## 2017-09-24 MED ORDER — PROMETHAZINE HCL 25 MG/ML IJ SOLN: 6.2500 mg | INTRAMUSCULAR | Status: DC | PRN

## 2017-09-24 MED ORDER — VALACYCLOVIR HCL 500 MG PO TABS
1000.0000 mg | ORAL_TABLET | Freq: Two times a day (BID) | ORAL | Status: DC
Start: 2017-09-24 — End: 2017-09-26
  Administered 2017-09-24 – 2017-09-26 (×4): 1000 mg via ORAL
  Filled 2017-09-24 (×4): qty 2

## 2017-09-24 MED ORDER — DIPHENHYDRAMINE HCL 25 MG PO CAPS
25.0000 mg | ORAL_CAPSULE | Freq: Four times a day (QID) | ORAL | Status: DC | PRN
Start: 2017-09-24 — End: 2017-09-26

## 2017-09-24 MED ORDER — KETOROLAC TROMETHAMINE 30 MG/ML IJ SOLN: 30.0000 mg | Freq: Four times a day (QID) | INTRAMUSCULAR | Status: AC | PRN

## 2017-09-24 MED ORDER — DIPHENHYDRAMINE HCL 25 MG PO CAPS
25.0000 mg | ORAL_CAPSULE | ORAL | Status: DC | PRN
  Filled 2017-09-24: qty 1

## 2017-09-24 MED ORDER — HYDROCODONE-ACETAMINOPHEN 7.5-325 MG PO TABS: 1.0000 | ORAL_TABLET | Freq: Once | ORAL | Status: DC | PRN

## 2017-09-24 MED ORDER — SCOPOLAMINE 1 MG/3DAYS TD PT72: 1.0000 | MEDICATED_PATCH | Freq: Once | TRANSDERMAL | Status: DC

## 2017-09-24 MED ORDER — WITCH HAZEL-GLYCERIN EX PADS: 1.0000 "application " | MEDICATED_PAD | CUTANEOUS | Status: DC | PRN

## 2017-09-24 MED ORDER — SODIUM CHLORIDE 0.9 % IR SOLN
Status: DC | PRN
  Administered 2017-09-24: 1

## 2017-09-24 MED ORDER — ACETAMINOPHEN 325 MG PO TABS
650.0000 mg | ORAL_TABLET | ORAL | Status: DC | PRN
  Filled 2017-09-24: qty 2

## 2017-09-24 MED ORDER — SIMETHICONE 80 MG PO CHEW
80.0000 mg | CHEWABLE_TABLET | ORAL | Status: DC
  Administered 2017-09-25 – 2017-09-26 (×2): 80 mg via ORAL
  Filled 2017-09-24 (×2): qty 1

## 2017-09-24 MED ORDER — PHENYLEPHRINE 8 MG IN D5W 100 ML (0.08MG/ML) PREMIX OPTIME
INJECTION | INTRAVENOUS | Status: DC | PRN
  Administered 2017-09-24: 60 ug/min via INTRAVENOUS

## 2017-09-24 MED ORDER — MORPHINE SULFATE (PF) 0.5 MG/ML IJ SOLN
INTRAMUSCULAR | Status: AC
  Filled 2017-09-24: qty 10

## 2017-09-24 MED ORDER — NALBUPHINE HCL 10 MG/ML IJ SOLN
5.0000 mg | INTRAMUSCULAR | Status: DC | PRN
  Administered 2017-09-24: 5 mg via SUBCUTANEOUS
  Filled 2017-09-24: qty 1

## 2017-09-24 SURGICAL SUPPLY — 29 items
CHLORAPREP W/TINT 26ML (MISCELLANEOUS) ×2 IMPLANT
CLAMP CORD UMBIL (MISCELLANEOUS) IMPLANT
CLOTH BEACON ORANGE TIMEOUT ST (SAFETY) ×2 IMPLANT
DRSG OPSITE POSTOP 4X10 (GAUZE/BANDAGES/DRESSINGS) ×2 IMPLANT
ELECT REM PT RETURN 9FT ADLT (ELECTROSURGICAL) ×2
ELECTRODE REM PT RTRN 9FT ADLT (ELECTROSURGICAL) ×1 IMPLANT
EXTRACTOR VACUUM M CUP 4 TUBE (SUCTIONS) IMPLANT
GLOVE BIOGEL PI IND STRL 7.0 (GLOVE) ×3 IMPLANT
GLOVE BIOGEL PI INDICATOR 7.0 (GLOVE) ×3
GLOVE ECLIPSE 7.0 STRL STRAW (GLOVE) ×2 IMPLANT
GOWN STRL REUS W/TWL LRG LVL3 (GOWN DISPOSABLE) ×4 IMPLANT
KIT ABG SYR 3ML LUER SLIP (SYRINGE) IMPLANT
NDL HYPO 25X5/8 SAFETYGLIDE (NEEDLE) ×1 IMPLANT
NEEDLE HYPO 22GX1.5 SAFETY (NEEDLE) ×2 IMPLANT
NEEDLE HYPO 25X5/8 SAFETYGLIDE (NEEDLE) ×2 IMPLANT
NS IRRIG 1000ML POUR BTL (IV SOLUTION) ×2 IMPLANT
PACK C SECTION WH (CUSTOM PROCEDURE TRAY) ×2 IMPLANT
PAD ABD 7.5X8 STRL (GAUZE/BANDAGES/DRESSINGS) ×4 IMPLANT
PAD OB MATERNITY 4.3X12.25 (PERSONAL CARE ITEMS) ×2 IMPLANT
PENCIL SMOKE EVAC W/HOLSTER (ELECTROSURGICAL) ×2 IMPLANT
RTRCTR C-SECT PINK 25CM LRG (MISCELLANEOUS) IMPLANT
SUT PDS AB 0 CTX 36 PDP370T (SUTURE) IMPLANT
SUT PLAIN 2 0 XLH (SUTURE) IMPLANT
SUT VIC AB 0 CTX 36 (SUTURE) ×4
SUT VIC AB 0 CTX36XBRD ANBCTRL (SUTURE) ×2 IMPLANT
SUT VIC AB 4-0 KS 27 (SUTURE) ×2 IMPLANT
SYR CONTROL 10ML LL (SYRINGE) ×2 IMPLANT
TOWEL OR 17X24 6PK STRL BLUE (TOWEL DISPOSABLE) ×2 IMPLANT
TRAY FOLEY W/BAG SLVR 14FR LF (SET/KITS/TRAYS/PACK) ×2 IMPLANT

## 2017-09-24 NOTE — Consult Note (Addendum)
Neonatology Note:   Attendance at C-section:    I was asked by Dr. Anyanwu to attend this primary C/S at term due to active HSV lesion. The mother is a G1, GBS neg with good prenatal care on prophylactic Valtrex plus with GDM  Diet controlled. ROM 0 hours before delivery, fluid clear. Infant vigorous with good spontaneous cry and tone. +60 sec DCC.  Needed only minimal bulb suctioning. Ap 8/9. Lungs clear to ausc in DR. To CN to care of Pediatrician.  David C. Ehrmann, MD 

## 2017-09-24 NOTE — Op Note (Signed)
Julie MauGabrieal Brechtel PROCEDURE DATE: 09/24/2017  PREOPERATIVE DIAGNOSES: Intrauterine pregnancy at 596w1d weeks gestation; active genital herpes virus infection  POSTOPERATIVE DIAGNOSES: The same  PROCEDURE: Primary Low Transverse Cesarean Section  SURGEON:  Dr. Jaynie CollinsUgonna Jalien Weakland  ANESTHESIOLOGY TEAM: Anesthesiologist: Trevor IhaHouser, Stephen A, MD CRNA: Rica Recordsickelton, Angela, CRNA  INDICATIONS: Julie Compton is a 27 y.o. G1P0 at [redacted]w[redacted]d here for cesarean section secondary to the indications listed under preoperative diagnoses; please see preoperative note for further details.  The risks of cesarean section were discussed with the patient including but were not limited to: bleeding which may require transfusion or reoperation; infection which may require antibiotics; injury to bowel, bladder, ureters or other surrounding organs; injury to the fetus; need for additional procedures including hysterectomy in the event of a life-threatening hemorrhage; placental abnormalities wth subsequent pregnancies, incisional problems, thromboembolic phenomenon and other postoperative/anesthesia complications.   The patient concurred with the proposed plan, giving informed written consent for the procedure.    FINDINGS:  Viable female infant in cephalic presentation.  Apgars 8 and 9.  Clear amniotic fluid.  Intact placenta, three vessel cord.  Normal uterus, fallopian tubes and ovaries bilaterally.  ANESTHESIA: Spinal INTRAVENOUS FLUIDS: 2400 ml   ESTIMATED BLOOD LOSS: 759 ml URINE OUTPUT:  200 ml SPECIMENS: Placenta sent to L&D COMPLICATIONS: None immediate  PROCEDURE IN DETAIL:  The patient preoperatively received intravenous antibiotics and had sequential compression devices applied to her lower extremities.  She was then taken to the operating room where spinal anesthesia was administered and was found to be adequate. She was then placed in a dorsal supine position with a leftward tilt, and prepped and draped in a  sterile manner.  A foley catheter was placed into her bladder and attached to constant gravity.  After an adequate timeout was performed, a Pfannenstiel skin incision was made with scalpel and carried through to the underlying layer of fascia. The fascia was incised in the midline, and this incision was extended bilaterally using the Mayo scissors.  Kocher clamps were applied to the superior aspect of the fascial incision and the underlying rectus muscles were dissected off bluntly.  A similar process was carried out on the inferior aspect of the fascial incision. The rectus muscles were separated in the midline and the peritoneum was entered bluntly. The Alexis self-retaining retractor was introduced into the abdominal cavity.  Attention was turned to the lower uterine segment where a low transverse hysterotomy was made with a scalpel and extended bilaterally bluntly.  The infant was successfully delivered, the cord was clamped and cut after one minute, and the infant was handed over to the awaiting neonatology team. Uterine massage was then administered, and the placenta delivered intact with a three-vessel cord. The uterus was then cleared of clots and debris.  The hysterotomy was closed with 0 Vicryl in a running locked fashion, and an imbricating layer was also placed with 0 Vicryl.  Figure-of-eight 0 Vicryl serosal stitches were placed to help with hemostasis.  The pelvis was cleared of all clot and debris. Hemostasis was confirmed on all surfaces.  The retractor was removed.  The peritoneum was closed with a 0 Vicryl running stitch and the rectus muscles were reapproximated using 0 Vicryl interrupted stitches. The fascia was then closed using 0 Vicryl  in a running fashion.  The subcutaneous layer was irrigated, injected with 30 ml of 0.5% Marcaine, and the skin was closed with a 4-0 Vicryl subcuticular stitch. The patient tolerated the procedure well. Sponge, instrument and needle  counts were correct x 3.   She was taken to the recovery room in stable condition.    Jaynie Collins, MD, FACOG Obstetrician & Gynecologist, [facility] for [employer], [facility]

## 2017-09-24 NOTE — Anesthesia Procedure Notes (Addendum)
Spinal  Patient location during procedure: OB Start time: 09/24/2017 12:25 PM End time: 09/24/2017 12:30 PM Staffing Anesthesiologist: Trevor IhaHouser, Ronalda Walpole A, MD Performed: anesthesiologist  Preanesthetic Checklist Completed: patient identified, surgical consent, pre-op evaluation, timeout performed, IV checked, risks and benefits discussed and monitors and equipment checked Spinal Block Patient position: sitting Prep: site prepped and draped and DuraPrep Patient monitoring: heart rate, cardiac monitor, continuous pulse ox and blood pressure Approach: midline Location: L3-4 Injection technique: single-shot Needle Needle type: Pencan  Needle gauge: 24 G Needle length: 10 cm Assessment Sensory level: T4

## 2017-09-24 NOTE — Anesthesia Preprocedure Evaluation (Signed)
Anesthesia Evaluation  Patient identified by MRN, date of birth, ID band Patient awake    Reviewed: Allergy & Precautions, NPO status , Patient's Chart, lab work & pertinent test results  Airway Mallampati: II  TM Distance: >3 FB Neck ROM: Full    Dental no notable dental hx. (+) Teeth Intact, Dental Advisory Given   Pulmonary neg pulmonary ROS,    Pulmonary exam normal breath sounds clear to auscultation       Cardiovascular negative cardio ROS Normal cardiovascular exam Rhythm:Regular Rate:Normal     Neuro/Psych negative neurological ROS     GI/Hepatic   Endo/Other  diabetes, Gestational  Renal/GU      Musculoskeletal   Abdominal (+) + obese,   Peds  Hematology negative hematology ROS (+)   Anesthesia Other Findings   Reproductive/Obstetrics (+) Pregnancy                             Lab Results  Component Value Date   WBC 6.9 09/24/2017   HGB 10.4 (L) 09/24/2017   HCT 31.5 (L) 09/24/2017   MCV 78.8 09/24/2017   PLT 323 09/24/2017    Anesthesia Physical Anesthesia Plan  ASA: III  Anesthesia Plan: Spinal   Post-op Pain Management:    Induction:   PONV Risk Score and Plan:   Airway Management Planned: Mask, Natural Airway and Nasal Cannula  Additional Equipment:   Intra-op Plan:   Post-operative Plan:   Informed Consent: I have reviewed the patients History and Physical, chart, labs and discussed the procedure including the risks, benefits and alternatives for the proposed anesthesia with the patient or authorized representative who has indicated his/her understanding and acceptance.   Dental advisory given  Plan Discussed with: CRNA  Anesthesia Plan Comments:         Anesthesia Quick Evaluation

## 2017-09-24 NOTE — Progress Notes (Signed)
Family asked to leave the room for patient privacy. Dr. Macon LargeAnyanwu at the bedside to evaluate patient and HSV status. Discussed POC with patient and mother.

## 2017-09-24 NOTE — Progress Notes (Signed)
Dr. Linwood Dibblesumball notified of current situation and requested to evaluate patient as soon as possible. Will not start IOL until MD has evaluated patient. Pt reports that she ate at 0630 this am.

## 2017-09-24 NOTE — Progress Notes (Signed)
Spoke with patient regarding her concerns of having an HSV lesion all last week and a healing lesion currently. Advised her that FP will need to do a spec vag exam to visualize the area. Pt verbalized understanding and request that all this be kept in confidence.

## 2017-09-24 NOTE — Transfer of Care (Signed)
Immediate Anesthesia Transfer of Care Note  Patient: Julie Compton  Procedure(s) Performed: CESAREAN SECTION (N/A )  Patient Location: PACU  Anesthesia Type:Spinal  Level of Consciousness: awake, alert  and oriented  Airway & Oxygen Therapy: Patient Spontanous Breathing  Post-op Assessment: Report given to RN and Post -op Vital signs reviewed and stable  Post vital signs: Reviewed and stable  Last Vitals:  Vitals Value Taken Time  BP 112/66 09/24/2017  1:39 PM  Temp    Pulse 87 09/24/2017  1:42 PM  Resp 18 09/24/2017  1:42 PM  SpO2 100 % 09/24/2017  1:42 PM  Vitals shown include unvalidated device data.  Last Pain:  Vitals:   09/24/17 0854  TempSrc:   PainSc: 0-No pain      Patients Stated Pain Goal: 5 (09/24/17 0854)  Complications: No apparent anesthesia complications

## 2017-09-24 NOTE — Anesthesia Postprocedure Evaluation (Signed)
Anesthesia Post Note  Patient: Julie Compton  Procedure(s) Performed: CESAREAN SECTION (N/A )     Patient location during evaluation: PACU Anesthesia Type: Spinal Level of consciousness: oriented and awake and alert Pain management: pain level controlled Vital Signs Assessment: post-procedure vital signs reviewed and stable Respiratory status: spontaneous breathing, respiratory function stable and patient connected to nasal cannula oxygen Cardiovascular status: blood pressure returned to baseline and stable Postop Assessment: no headache, no backache and no apparent nausea or vomiting Anesthetic complications: no    Last Vitals:  Vitals:   09/24/17 1430 09/24/17 1450  BP: 111/72 110/75  Pulse: 80 70  Resp: 17 16  Temp: 37.2 C 37 C  SpO2: 100% 99%    Last Pain:  Vitals:   09/24/17 1500  TempSrc:   PainSc: 0-No pain   Pain Goal: Patients Stated Pain Goal: 5 (09/24/17 0854)               Trevor IhaStephen A Quamir Willemsen

## 2017-09-24 NOTE — H&P (Signed)
Obstetric Preoperative History and Physical  Julie Compton is a 27 y.o. G1P0 with IUP at 7683w1d presenting for cesarean section secondary to active episode of genital herpes.  Patient actually showed up today for scheduled IOL but reported having an outbreak last week which was confirmed on exam. Has only been taking HSV suppression therapy since 38 weeks. Patient understands need for cesarean delivery to avoid vertical transmission and fetal herpetic encephalitis which can be fatal; vaginal delivery is absolutely contraindicated. Of note, patient also has A1GDM.  Reports good fetal movement, no bleeding, no contractions, no LOF. No acute preoperative concerns.   Prenatal Course Source [facility] Care: [facility] Pregnancy complications or risks: Nursing Staff Provider  Office Location  [facility] Dating  LMP and 14 wk us  Language  English Anatomy US  WNL  Flu Vaccine  declined Genetic Screen   Quad: WNL   TDaP vaccine   07/07/17 Hgb A1C or  GTT Early abnl 1 hour, passed 3 hour Third trimester failed 3 hour  Rhogam  n/a   LAB RESULTS   Feeding Plan Breast Blood Type B/Positive/-- (12/28 0000)   Contraception undecided Antibody Negative (12/28 0000)  Circumcision n/a Rubella Immune (12/28 0000)  Pediatrician  undecided RPR Nonreactive (04/25 0000)   Support Person Mom-Julie Compton HBsAg Negative (12/28 0000)   Prenatal Classes  n/a too late HIV Non-reactive (12/28 0000)  BTL Consent n/a GBS   Negative  VBAC Consent n/a Pap 12/18 Neg    Hgb Electro  Nml AA    CF Neg    Patient Active Problem List   Diagnosis Date Noted  . Genital herpes complicating pregnancy, antepartum 08/31/2017  . Gestational diabetes 07/26/2017  . Supervision of high risk pregnancy, antepartum 07/18/2017    Past Medical History:  Diagnosis Date  . Gestational diabetes   . Herpes genitalis 2015  . Palpitations     Past Surgical History:  Procedure Laterality Date  . NO PAST SURGERIES      OB History  Gravida Para  Term Preterm AB Living  1         0  SAB TAB Ectopic Multiple Live Births               # Outcome Date GA Lbr Len/2nd Weight Sex Delivery Anes PTL Lv  1 Current             Social History   Socioeconomic History  . Marital status: Single    Spouse name: Not on file  . Number of children: Not on file  . Years of education: Not on file  . Highest education level: Not on file  Occupational History  . Not on file  Social Needs  . Financial resource strain: Not on file  . Food insecurity:    Worry: Not on file    Inability: Not on file  . Transportation needs:    Medical: Not on file    Non-medical: Not on file  Tobacco Use  . Smoking status: Never Smoker  . Smokeless tobacco: Never Used  Substance and Sexual Activity  . Alcohol use: No  . Drug use: No  . Sexual activity: Yes    Birth control/protection: None  Lifestyle  . Physical activity:    Days per week: Not on file    Minutes per session: Not on file  . Stress: Not on file  Relationships  . Social connections:    Talks on phone: Not on file    Gets together: Not on  file    Attends religious service: Not on file    Active member of club or organization: Not on file    Attends meetings of clubs or organizations: Not on file    Relationship status: Not on file  Other Topics Concern  . Not on file  Social History Narrative  . Not on file    Family History  Problem Relation Age of Onset  . Diabetes Mother     Medications Prior to Admission  Medication Sig Dispense Refill Last Dose  . Prenatal Vit w/Fe-Methylfol-FA (PNV PO) Take by mouth.   09/24/2017 at Unknown time  . valACYclovir (VALTREX) 1000 MG tablet Take 1 tablet (1,000 mg total) by mouth daily. 30 tablet 0 09/24/2017 at Unknown time  . ACCU-CHEK FASTCLIX LANCETS MISC 1 each by Percutaneous route 4 (four) times daily. 100 each 12 Taking  . glucose blood (ACCU-CHEK GUIDE) test strip 1 each by Other route 4 (four) times daily. Use as instructed 100 each  12 Taking  . miconazole (MONISTAT 7) 2 % vaginal cream Place 1 Applicatorful vaginally at bedtime for 7 days. (Patient not taking: Reported on 09/24/2017) 45 g 0 Not Taking at Unknown time    No Known Allergies  Review of Systems: Pertinent items noted in HPI and remainder of comprehensive ROS otherwise negative.  Physical Exam: BP 123/78   Pulse 91   Temp 99.3 F (37.4 C) (Oral)   Resp 18   Ht 5\' 5"  (1.651 m)   Wt 214 lb 8 oz (97.3 kg)   LMP 12/17/2016 (Approximate)   BMI 35.69 kg/m  FHR Tracing: Reactive with baseline of 145 bpm CONSTITUTIONAL: Well-developed, well-nourished female in no acute distress.  HENT:  Normocephalic, atraumatic, External right and left ear normal. Oropharynx is clear and moist EYES: Conjunctivae and EOM are normal. Pupils are equal, round, and reactive to light. No scleral icterus.  NECK: Normal range of motion, supple, no masses SKIN: Skin is warm and dry. No rash noted. Not diaphoretic. No erythema. No pallor. NEUROLGIC: Alert and oriented to person, place, and time. Normal reflexes, muscle tone coordination. No cranial nerve deficit noted. PSYCHIATRIC: Normal mood and affect. Normal behavior. Normal judgment and thought content. CARDIOVASCULAR: Normal heart rate noted, regular rhythm RESPIRATORY: Effort and breath sounds normal, no problems with respiration noted ABDOMEN: Soft, nontender, nondistended, gravid. PELVIC: Active  HSV lesions noted on clitoral hood and labia minora MUSCULOSKELETAL: Normal range of motion. No edema and no tenderness. 2+ distal pulses.   Pertinent Labs/Studies:   Results for orders placed or performed during the hospital encounter of 09/24/17 (from the past 72 hour(s))  CBC     Status: Abnormal   Collection Time: 09/24/17  9:57 AM  Result Value Ref Range   WBC 6.9 4.0 - 10.5 K/uL   RBC 4.00 3.87 - 5.11 MIL/uL   Hemoglobin 10.4 (L) 12.0 - 15.0 g/dL   HCT 16.1 (L) 09.6 - 04.5 %   MCV 78.8 78.0 - 100.0 fL   MCH 26.0  26.0 - 34.0 pg   MCHC 33.0 30.0 - 36.0 g/dL   RDW 40.9 81.1 - 91.4 %   Platelets 323 150 - 400 K/uL    Comment: Performed at [facility], [address] [place], [place] [address]  Type and screen [facility]     Status: None   Collection Time: 09/24/17  9:57 AM  Result Value Ref Range   ABO/RH(D) B POS    Antibody Screen NEG  Sample Expiration      09/27/2017 Performed at [facility], [address], [place], [place] [address]   Glucose, capillary     Status: None   Collection Time: 09/24/17 10:44 AM  Result Value Ref Range   Glucose-Capillary 77 70 - 99 mg/dL    Assessment and Plan :Elynn Patteson is a 27 y.o. G1P0 at [redacted]w[redacted]d being admitted for cesarean section. The risks of cesarean section discussed with the patient included but were not limited to: bleeding which may require transfusion or reoperation; infection which may require antibiotics; injury to bowel, bladder, ureters or other surrounding organs; injury to the fetus; need for additional procedures including hysterectomy in the event of a life-threatening hemorrhage; placental abnormalities wth subsequent pregnancies, incisional problems, thromboembolic phenomenon and other postoperative/anesthesia complications. The patient concurred with the proposed plan, giving informed written consent for the procedure. Patient has been NPO since last night she will remain NPO for procedure. Anesthesia and OR aware. Preoperative prophylactic antibiotics and SCDs ordered on call to the OR. To OR when ready.    Jaynie Collins, MD, FACOG Obstetrician & Gynecologist, [facility] for [employer], [facility]

## 2017-09-25 LAB — CBC
HEMATOCRIT: 25.9 % — AB (ref 36.0–46.0)
HEMOGLOBIN: 8.5 g/dL — AB (ref 12.0–15.0)
MCH: 25.8 pg — ABNORMAL LOW (ref 26.0–34.0)
MCHC: 32.8 g/dL (ref 30.0–36.0)
MCV: 78.7 fL (ref 78.0–100.0)
Platelets: 266 10*3/uL (ref 150–400)
RBC: 3.29 MIL/uL — ABNORMAL LOW (ref 3.87–5.11)
RDW: 14.9 % (ref 11.5–15.5)
WBC: 18.4 10*3/uL — AB (ref 4.0–10.5)

## 2017-09-25 LAB — CREATININE, SERUM: Creatinine, Ser: 0.54 mg/dL (ref 0.44–1.00)

## 2017-09-25 LAB — GLUCOSE, CAPILLARY: Glucose-Capillary: 152 mg/dL — ABNORMAL HIGH (ref 70–99)

## 2017-09-25 LAB — RPR: RPR: NONREACTIVE

## 2017-09-25 NOTE — Lactation Note (Signed)
This note was copied from a baby's chart. Lactation Consultation Note  Patient Name: Julie Compton ZOXWR'UToday's Date: 09/25/2017 Reason for consult: Initial assessment;Difficult latch;Primapara Baby is 6624 hours old and has had difficulty latching to breast.  Baby has received formula with syringe feeding x 1.  Assisted with positioning baby in football hold.  Baby latches but has difficulty sustaining latch.  After several attempts a 20 mm nipple shield applied.  Baby latched and sucked off and on for 10 minutes.  Baby was syringe fed 8 mls of formula after breastfeeding.  Instructed mom to post pump to stimulate milk supply.  Plan is to breastfeed with cues using a 20 mm nipple shield, post pump and give any expressed milk to baby.  Mom will supplement with formula if baby continues to act hungry.  Mom would like to use the bottle she brought from home.  Maternal Data Has patient been taught Hand Expression?: Yes Does the patient have breastfeeding experience prior to this delivery?: No  Feeding Feeding Type: Breast Fed Length of feed: 10 min  LATCH Score Latch: Repeated attempts needed to sustain latch, nipple held in mouth throughout feeding, stimulation needed to elicit sucking reflex.  Audible Swallowing: A few with stimulation  Type of Nipple: Everted at rest and after stimulation(SHORT)  Comfort (Breast/Nipple): Soft / non-tender  Hold (Positioning): Assistance needed to correctly position infant at breast and maintain latch.  LATCH Score: 7  Interventions Interventions: Assisted with latch;Breast compression;Skin to skin;Adjust position;Breast massage;Support pillows;Hand express;Pre-pump if needed  Lactation Tools Discussed/Used Tools: Nipple Shields Nipple shield size: 20 Pump Review: Setup, frequency, and cleaning Initiated by:: RN Date initiated:: 09/25/17   Consult Status Consult Status: Follow-up Date: 09/26/17 Follow-up type: In-patient    Huston FoleyMOULDEN,  Makynlee Kressin S 09/25/2017, 2:09 PM

## 2017-09-25 NOTE — Progress Notes (Signed)
POSTPARTUM PROGRESS NOTE  Post Operative Day 1 Subjective:  Pia MauGabrieal Cartwright is a 27 y.o. G1P0 2835w2d s/p PLTCS for active HSV outbreak with hx of A1GDM in current pregnancy.  No acute events overnight.  Pt just recently got her catheter out so has not been able to urinate yet on her own and has ambulated a little bit to the bathroom and back.  Pt is eating okay. She denies nausea or vomiting. She has not had bowel movement.  Lochia Small.   Objective: Blood pressure 95/63, pulse 65, temperature 97.8 F (36.6 C), temperature source Oral, resp. rate 18, height 5\' 5"  (1.651 m), weight 97.3 kg (214 lb 8 oz), last menstrual period 12/17/2016, SpO2 95 %.  Physical Exam:  General: alert, cooperative and no distress Lochia:normal flow Chest: no respiratory distress Heart:regular rate, distal pulses intact Incision: No discharge, clean and dry Abdomen: soft, nontender,  Uterine Fundus: firm, appropriately tender DVT Evaluation: No calf swelling or tenderness Extremities: No edema  Recent Labs    09/24/17 0957 09/25/17 0539  HGB 10.4* 8.5*  HCT 31.5* 25.9*   CBG (last 3)  Recent Labs    09/24/17 1044 09/25/17 0655  GLUCAP 77 152*    Assessment/Plan:  ASSESSMENT: Pia MauGabrieal Grill is a 27 y.o. G1P0 7335w2d s/p PLTCS for active HSV outbreak.  Plan for discharge tomorrow, Breastfeeding, Lactation consult and Contraception Abstinence  Going to start Iron d/t decreased HGB Continue Valtrex for HSV lesions Continue Lovenox for DVT prophylaxis   Rolland BimlerLexi J Sakeenah Valcarcel, MS3 09/25/2017, 7:36 AM

## 2017-09-26 LAB — GLUCOSE, CAPILLARY: Glucose-Capillary: 77 mg/dL (ref 70–99)

## 2017-09-26 MED ORDER — IBUPROFEN 600 MG PO TABS: 600.0000 mg | ORAL_TABLET | Freq: Four times a day (QID) | ORAL | 0 refills | Status: DC

## 2017-09-26 MED ORDER — OXYCODONE-ACETAMINOPHEN 5-325 MG PO TABS: 1.0000 | ORAL_TABLET | ORAL | 0 refills | Status: DC | PRN

## 2017-09-26 NOTE — Discharge Instructions (Signed)

## 2017-09-26 NOTE — Discharge Summary (Addendum)
OB Discharge Summary     Patient Name: Julie Compton DOB: 10-12-1990 MRN: [id number]  Date of admission: 09/24/2017 Delivering MD: Jaynie CollinsANYANWU, UGONNA A   Date of discharge: 09/26/2017  Admitting diagnosis: 40wks, induction Intrauterine pregnancy: 2342w3d     Secondary diagnosis:  Principal Problem:   Genital herpes complicating pregnancy, antepartum Active Problems:   Supervision of high risk pregnancy, antepartum   Gestational diabetes   S/P cesarean section  Additional problems: HSV, A1GDM     Discharge diagnosis: Term Pregnancy Delivered, GDM A1 and active HSV infection                                                                                                Post partum procedures:none  Augmentation: none  Complications: None  Hospital course:  Sceduled C/S   27 y.o. yo G1P0 at 7442w3d was admitted to the hospital 09/24/2017 for scheduled cesarean section with the following indication:active HSV.  Membrane Rupture Time/Date: 12:48 PM ,09/24/2017   Patient delivered a Viable infant.09/24/2017  Details of operation can be found in separate operative note.  Pateint had an uncomplicated postpartum course.  She is ambulating, tolerating a regular diet, passing flatus, and urinating well. Patient is discharged home in stable condition on  09/26/17         Physical exam  Vitals:   09/25/17 1213 09/25/17 1404 09/25/17 2131 09/26/17 0551  BP: 103/69 117/68 121/74 108/84  Pulse: 73 73 76 93  Resp: 20 18 16 18   Temp: 98 F (36.7 C) 98.3 F (36.8 C) 98.4 F (36.9 C) 98.4 F (36.9 C)  TempSrc:  Oral Oral Oral  SpO2: 98% 99%    Weight:      Height:       General: alert, cooperative and no distress Lochia: appropriate Uterine Fundus: firm Incision: Healing well with no significant drainage DVT Evaluation: No evidence of DVT seen on physical exam. Labs: Lab Results  Component Value Date   WBC 18.4 (H) 09/25/2017   HGB 8.5 (L) 09/25/2017   HCT 25.9 (L) 09/25/2017   MCV 78.7 09/25/2017   PLT 266 09/25/2017   CMP Latest Ref Rng & Units 09/25/2017  Creatinine 0.44 - 1.00 mg/dL 4.090.54    Discharge instruction: per After Visit Summary and "Baby and Me Booklet".  After visit meds:  Allergies as of 09/26/2017   No Known Allergies     Medication List    STOP taking these medications   ACCU-CHEK FASTCLIX LANCETS Misc   glucose blood test strip Commonly known as:  ACCU-CHEK GUIDE   PNV PO     TAKE these medications   ibuprofen 600 MG tablet Commonly known as:  ADVIL,MOTRIN Take 1 tablet (600 mg total) by mouth every 6 (six) hours.   miconazole 2 % vaginal cream Commonly known as:  MONISTAT 7 Place 1 Applicatorful vaginally at bedtime for 7 days.   oxyCODONE-acetaminophen 5-325 MG tablet Commonly known as:  PERCOCET/ROXICET Take 1 tablet by mouth every 4 (four) hours as needed (pain scale 4-7).   valACYclovir 1000 MG tablet Commonly known as:  VALTREX Take 1  tablet (1,000 mg total) by mouth daily.       Diet: routine diet  Activity: Advance as tolerated. Pelvic rest for 6 weeks.   Outpatient follow up:1 week incision check, 4 week postpartum visit Follow up Appt:No future appointments. Follow up Visit: Follow-up Information    [facility] Follow up in 1 week(s).   Why:  you need to have a followup appointment in one week.  please call [facility] to make sure you have an appointment scheduled.  Contact information: [address] [place] [address] [phone]          Postpartum contraception: None  Newborn Data: Live born female  Birth Weight: 6 lb 7.4 oz (2930 g) APGAR: 8, 9  Newborn Delivery   Birth date/time:  09/24/2017 12:48:00 Delivery type:  C-Section, Low Transverse Trial of labor:  No C-section categorization:  Primary     Baby Feeding: Breast Disposition:home with mother   09/26/2017 Sandre Kitty, MD  OB FELLOW DISCHARGE ATTESTATION  I have seen and  examined this patient. I agree with above documentation and have made edits as needed.   Caryl Ada, DO OB Fellow 10:12 AM

## 2017-09-26 NOTE — Progress Notes (Signed)
CSW received call from RN stating that MOB seemed very tearful today and requested that CSW speak with her to offer support.  CSW met with MOB in her first floor room/126.  MOB had her cousin with her and stated that we could talk openly with her family present.  MOB presented as pleasant, quiet, and easy to engage.  She was not emotional at this time, but acknowledges feeling "overwhelmed."  MOB reports common reasons to feel overwhelmed after having a baby such as adjusting to breast feeding when her milk has not yet come in, calling for appointments when receptionists don't answer, and providing necessary paperwork to her employer.  CSW normalized and validated her feelings.  CSW inquired about her support people.  MOB reports having good supports, including family, friends and FOB.  She reports that she and FOB are not in a relationship at this time, but that he is involved and supportive.  CSW suggests making a to-do list and allowing her support people to help her check off items.  CSW encouraged her to be gentle with herself and remind herself that not everything needs to be accomplished immediately.  CSW suggests ranking the items on her list and encouraged her to enjoy this time with baby.  CSW provided education regarding PMADs encouraged MOB to evaluate her mental health throughout the postpartum time period.  CSW asked to please notify a medical professional if symptoms arise.  MOB was engaged and attentive and agreed to talk about her feelings if she has concerns.  CSW identifies no barriers to discharge.  MOB states no questions, concerns or needs at this time.

## 2017-09-27 ENCOUNTER — Telehealth (HOSPITAL_COMMUNITY): Payer: Self-pay | Admitting: Lactation Services

## 2017-09-27 ENCOUNTER — Encounter (HOSPITAL_COMMUNITY): Payer: Self-pay

## 2017-10-07 ENCOUNTER — Inpatient Hospital Stay (HOSPITAL_COMMUNITY)
Admission: AD | Admit: 2017-10-07 | Discharge: 2017-10-07 | Disposition: A | Discharge status: Home / Self Care | Payer: Medicaid Other | Source: Ambulatory Visit | Attending: Obstetrics and Gynecology | Admitting: Obstetrics and Gynecology

## 2017-10-07 ENCOUNTER — Encounter (HOSPITAL_COMMUNITY): Payer: Self-pay | Admitting: *Deleted

## 2017-10-07 DIAGNOSIS — O900 Disruption of cesarean delivery wound: Secondary | ICD-10-CM | POA: Diagnosis present

## 2017-10-07 DIAGNOSIS — Z8632 Personal history of gestational diabetes: Secondary | ICD-10-CM | POA: Diagnosis not present

## 2017-10-07 NOTE — MAU Note (Signed)
Had primary c/s 7/13 due to HSV outbreak. Was out today and squatted down. When got home saw some bright blood on underwear. Slight pain RLQ of incision where saw bleeding. (Incision clean and dry in triage)

## 2017-10-07 NOTE — Discharge Instructions (Signed)

## 2017-10-07 NOTE — MAU Provider Note (Signed)
Chief Complaint: incision bleeding   First Provider Initiated Contact with Patient 10/07/17 2005     SUBJECTIVE HPI: Julie Compton is a 27 y.o. G1P1001 at 2 wks s/p LTCS who presents to Maternity Admissions reporting incisional pain and bleeding. Had primary c/section d/t HSV outbreak. States she squatted to lift something today when her symptoms began. Has noticed spots of blood coming from her incision. Also has pain at the right side of incision. Denies fever or foul smelling discharge.  Denies pain at this time.    Past Medical History:  Diagnosis Date  . Gestational diabetes   . Herpes genitalis 2015  . Palpitations    OB History  Gravida Para Term Preterm AB Living  1 1 1     1   SAB TAB Ectopic Multiple Live Births        0 1    # Outcome Date GA Lbr Len/2nd Weight Sex Delivery Anes PTL Lv  1 Term 09/24/17 533w1d  6 lb 7.4 oz (2.93 kg) F CS-LTranv Spinal  LIV   Past Surgical History:  Procedure Laterality Date  . CESAREAN SECTION N/A 09/24/2017   Procedure: CESAREAN SECTION;  Surgeon: Tereso NewcomerAnyanwu, Ugonna A, MD;  Location: [facility];  Service: Obstetrics;  Laterality: N/A;   Social History   Socioeconomic History  . Marital status: Single    Spouse name: Not on file  . Number of children: Not on file  . Years of education: Not on file  . Highest education level: Not on file  Occupational History  . Not on file  Social Needs  . Financial resource strain: Not on file  . Food insecurity:    Worry: Not on file    Inability: Not on file  . Transportation needs:    Medical: Not on file    Non-medical: Not on file  Tobacco Use  . Smoking status: Never Smoker  . Smokeless tobacco: Never Used  Substance and Sexual Activity  . Alcohol use: No  . Drug use: No  . Sexual activity: Yes    Birth control/protection: None  Lifestyle  . Physical activity:    Days per week: Not on file    Minutes per session: Not on file  . Stress: Not on file  Relationships  .  Social connections:    Talks on phone: Not on file    Gets together: Not on file    Attends religious service: Not on file    Active member of club or organization: Not on file    Attends meetings of clubs or organizations: Not on file    Relationship status: Not on file  . Intimate partner violence:    Fear of current or ex partner: Not on file    Emotionally abused: Not on file    Physically abused: Not on file    Forced sexual activity: Not on file  Other Topics Concern  . Not on file  Social History Narrative  . Not on file   Family History  Problem Relation Age of Onset  . Diabetes Mother    No current facility-administered medications on file prior to encounter.    Current Outpatient Medications on File Prior to Encounter  Medication Sig Dispense Refill  . ibuprofen (ADVIL,MOTRIN) 600 MG tablet Take 1 tablet (600 mg total) by mouth every 6 (six) hours. 30 tablet 0  . valACYclovir (VALTREX) 1000 MG tablet Take 1 tablet (1,000 mg total) by mouth daily. 30 tablet 0   No Known  Allergies  I have reviewed patient's Past Medical Hx, Surgical Hx, Family Hx, Social Hx, medications and allergies.   Review of Systems  Constitutional: Negative.   Gastrointestinal: Negative.   Genitourinary: Negative.   Skin: Positive for wound.    OBJECTIVE Patient Vitals for the past 24 hrs:  BP Temp Pulse Resp Height Weight  10/07/17 1928 113/84 - - - - -   Constitutional: Well-developed, well-nourished female in no acute distress.  Cardiovascular: normal rate & rhythm, no murmur Respiratory: normal rate and effort. Lung sounds clear throughout GI: Abd soft, non-tender, Pos BS x 4. No guarding or rebound tenderness  Incision well healing. 2 miniscule areas (1-2 mm) in center of incision that are superficially open, few drops  of serosanguinous discharge. No signs of infection.  MS: Extremities nontender, no edema, normal ROM Neurologic: Alert and oriented x 4.     LAB RESULTS No  results found for this or any previous visit (from the past 24 hour(s)).  IMAGING No results found.  MAU COURSE Orders Placed This Encounter  Procedures  . Discharge patient   No orders of the defined types were placed in this encounter.   MDM Reassuring exam. No evidence of infection or dehiscence. Patient has not received phone call for PP appt. Will send msg to the office  ASSESSMENT 1. Cesarean wound disruption     PLAN Discharge home in stable condition.   Allergies as of 10/07/2017   No Known Allergies     Medication List    STOP taking these medications   oxyCODONE-acetaminophen 5-325 MG tablet Commonly known as:  PERCOCET/ROXICET     TAKE these medications   ibuprofen 600 MG tablet Commonly known as:  ADVIL,MOTRIN Take 1 tablet (600 mg total) by mouth every 6 (six) hours.   valACYclovir 1000 MG tablet Commonly known as:  VALTREX Take 1 tablet (1,000 mg total) by mouth daily.        Judeth Horn, NP 10/07/2017  8:29 PM

## 2017-10-10 ENCOUNTER — Telehealth: Payer: Self-pay | Admitting: General Practice

## 2017-10-10 NOTE — Telephone Encounter (Signed)
Left message on VM for patient to give our office a call [facility] at 1:35pm with Dr. Macon LargeAnyanwu.

## 2017-10-26 ENCOUNTER — Encounter: Payer: Self-pay | Admitting: Obstetrics & Gynecology

## 2017-10-26 ENCOUNTER — Ambulatory Visit (INDEPENDENT_AMBULATORY_CARE_PROVIDER_SITE_OTHER): Payer: Medicaid Other | Admitting: Obstetrics & Gynecology

## 2017-10-26 DIAGNOSIS — O99345 Other mental disorders complicating the puerperium: Secondary | ICD-10-CM

## 2017-10-26 DIAGNOSIS — Z98891 History of uterine scar from previous surgery: Secondary | ICD-10-CM

## 2017-10-26 DIAGNOSIS — Z1389 Encounter for screening for other disorder: Secondary | ICD-10-CM

## 2017-10-26 DIAGNOSIS — F53 Postpartum depression: Secondary | ICD-10-CM

## 2017-10-26 DIAGNOSIS — Z8632 Personal history of gestational diabetes: Secondary | ICD-10-CM

## 2017-10-26 NOTE — Progress Notes (Signed)
  Pt scored 11 on [place], referred to [place]. Pt would like to discuss Birth Control Pills.

## 2017-10-26 NOTE — Progress Notes (Signed)
Subjective:     Julie Compton is a 27 y.o. 711P1001 female who presents for a postpartum visit. She is 4 weeks postpartum following a low transverse cesarean section. I have fully reviewed the prenatal and intrapartum course; had A1GDM. The delivery was at 40+1 gestational weeks, done for HSV. No other outbreaks.  Outcome: primary cesarean section, low transverse incision. Anesthesia: spinal. Postpartum course has been uncomplicated. Baby's course has been uncomplicated. Baby is feeding by bottle - Similac Sensitive RS. Bleeding no bleeding. Bowel function is normal. Bladder function is normal. Patient is not sexually active. Contraception method is none, abstient for now, plans condoms if she becomes sexually active. Postpartum depression screening: positive at 11, no SI/HI.   The following portions of the patient's history were reviewed and updated as appropriate: allergies, current medications, past family history, past medical history, past social history, past surgical history and problem list..  Last pap in 03/11/2017 was normal.   Review of Systems Pertinent items noted in HPI and remainder of comprehensive ROS otherwise negative.   Objective:    BP 127/76   Pulse 80   Ht 5\' 6"  (1.676 m)   Wt 199 lb 3.2 oz (90.4 kg)   Breastfeeding? No   BMI 32.15 kg/m   General:  alert and no distress   Breasts:  deferred  Lungs: clear to auscultation bilaterally  Heart:  regular rate and rhythm  Abdomen: soft, non-tender; bowel sounds normal; no masses,  no organomegaly. Incision C/D/I, no erythema. Midline small granulation tissue treated with silver nitrate. No drainage.  Pelvic:  not evaluated        Assessment and Plan:  1. History of gestational diabetes (diet-controlled) Will return in 2 weeks for postpartum 2 hr GTT  2. Mild postpartum depression Patient and/or legal guardian verbally consented to meet with Behavioral Health Clinician Asher Muir(Jamie) about presenting concerns. Unable to have  appointment today as Asher MuirJamie is not here, patient wants to come back in 2 weeks. SI/HI, worsening depression precautions reviewed. - Ambulatory referral to Integrated Behavioral Health   3. S/P cesarean section Midline small granulation tissue treated with silver nitrate, return for worsening symptoms.   4. Postpartum care and examination Otherwise, doing well. No other concerns.  Return in about 12 days (around 11/07/2017) for Postpartum 2 hr GTT (lab visit) and visit with Asher MuirJamie.   Jaynie CollinsUGONNA  Correen Bubolz, MD, FACOG Obstetrician & Gynecologist, [facility], [facility]

## 2017-10-31 ENCOUNTER — Other Ambulatory Visit: Payer: Medicaid Other

## 2017-11-04 ENCOUNTER — Other Ambulatory Visit: Payer: Self-pay

## 2017-11-04 NOTE — BH Specialist Note (Signed)
Integrated Behavioral Health Initial Visit  MRN: [id number] Name: Julie Compton  Number of Integrated Behavioral Health Clinician visits:: 1/6 Session Start time: 10:20  Session End time: 10:50 Total time: 30 minutes  Type of Service: Integrated Behavioral Health- Individual/Family Interpretor:No. Interpretor Name and Language: n/a   Warm Hand Off Completed.       SUBJECTIVE: Julie Compton is a 27 y.o. female accompanied by n/a Patient was referred by Jaynie CollinsUgonna Anyanwu, MD for mild depression postpartum. Patient reports the following symptoms/concerns: Pt states her primary concern today is that she felt overwhelmed in the first few weeks postpartum, and had difficulty adjusting with limited support; is feeling a bit better with  Duration of problem: Postpartum; Severity of problem: mild  OBJECTIVE: Mood: Depressed and Affect: Appropriate Risk of harm to self or others: No plan to harm self or others  LIFE CONTEXT: Family and Social: Pt lives with her mother and newborn School/Work: Plans to return to work in October Self-Care: Recognizing a greater need for self care Life Changes: Recent childbirth; move back home with mother  GOALS ADDRESSED: Patient will: 1. Reduce symptoms of: anxiety and depression 2. Increase knowledge and/or ability of: self-management skills  3. Demonstrate ability to: Increase healthy adjustment to current life circumstances and Increase adequate support systems for patient/family  INTERVENTIONS: Interventions utilized: Mindfulness or Management consultantelaxation Training, Psychoeducation and/or Health Education and Link to [employer] [employer]  Standardized Assessments completed: GAD-7 and PHQ 9   ASSESSMENT: Patient currently experiencing Adjustment disorder with mixed anxious and depressed mood.   Patient may benefit from psychoeducation and brief therapeutic interventions regarding coping with symptoms of depression and anxiety .  PLAN: 1. Follow up  with behavioral health clinician on : One week by phone; as needed in office 2. Behavioral recommendations:  -Continue to allow mother to help with baby, as needed -Attend at least one Mom Talk/Breastfeeding Support groups prior to return to work -CALM relaxation breathing exercise every morning; as needed throughout the day -Read educational materials regarding coping with symptoms of anxiety and depression  3. Referral(s): Integrated Behavioral Health Services (In Clinic) 4. "From scale of 1-10, how likely are you to follow plan?": 9  Rae LipsJamie C McMannes, LCSW    Depression screen [facility] 2/9 09/21/2017 09/13/2017 08/31/2017 08/15/2017 07/26/2017  Decreased Interest 1 1 2 1 1   Down, Depressed, Hopeless 0 0 1 0 0  PHQ - 2 Score 1 1 3 1 1   Altered sleeping 1 1 1 1 1   Tired, decreased energy 1 1 2 1 1   Change in appetite 1 1 2  0 0  Feeling bad or failure about yourself  0 0 1 0 0  Trouble concentrating 1 1 2  0 0  Moving slowly or fidgety/restless 1 0 1 0 0  Suicidal thoughts 0 0 0 0 0  PHQ-9 Score 6 5 12 3 3    GAD 7 : Generalized Anxiety Score 09/21/2017 09/13/2017 08/31/2017 08/15/2017  Nervous, Anxious, on Edge 1 1 3  0  Control/stop worrying 0 1 2 0  Worry too much - different things 0 1 2 0  Trouble relaxing 0 2 2 0  Restless 1 2 1  0  Easily annoyed or irritable 1 1 2 1   Afraid - awful might happen 0 1 2 0  Total GAD 7 Score 3 9 14  1

## 2017-11-07 ENCOUNTER — Other Ambulatory Visit: Payer: Medicaid Other

## 2017-11-07 ENCOUNTER — Ambulatory Visit (INDEPENDENT_AMBULATORY_CARE_PROVIDER_SITE_OTHER): Payer: Medicaid Other | Admitting: Clinical

## 2017-11-07 DIAGNOSIS — F4323 Adjustment disorder with mixed anxiety and depressed mood: Secondary | ICD-10-CM

## 2017-11-08 ENCOUNTER — Encounter: Payer: Self-pay | Admitting: Obstetrics & Gynecology

## 2017-11-08 LAB — GLUCOSE TOLERANCE, 2 HOURS
GLUCOSE FASTING GTT: 90 mg/dL (ref 65–99)
GLUCOSE, 2 HOUR: 91 mg/dL (ref 65–139)

## 2017-11-15 ENCOUNTER — Telehealth: Payer: Self-pay | Admitting: Clinical

## 2017-11-15 NOTE — Telephone Encounter (Signed)
Follow up mood check, as agreed-upon by pt: pt says she is feeling much better than at her last visit, she and baby are both sleeping better, she is feeling less fatigue, began taking iron pills, plans to sign up for My Chart, and is practicing daily relaxation breathing exercises.   Pt is aware she may return for a [facility] at [facility] if needed in the future.

## 2017-12-13 ENCOUNTER — Telehealth: Payer: Self-pay

## 2017-12-13 NOTE — Telephone Encounter (Signed)
Pt called stating she needed paperwork signed so she can go back to work.

## 2019-04-02 ENCOUNTER — Emergency Department (HOSPITAL_COMMUNITY): Payer: BLUE CROSS/BLUE SHIELD

## 2019-04-02 ENCOUNTER — Emergency Department (HOSPITAL_COMMUNITY)
Admission: EM | Admit: 2019-04-02 | Discharge: 2019-04-03 | Disposition: A | Discharge status: Home / Self Care | Payer: BLUE CROSS/BLUE SHIELD | Attending: Emergency Medicine | Admitting: Emergency Medicine

## 2019-04-02 ENCOUNTER — Other Ambulatory Visit: Payer: Self-pay

## 2019-04-02 DIAGNOSIS — Z79899 Other long term (current) drug therapy: Secondary | ICD-10-CM | POA: Insufficient documentation

## 2019-04-02 DIAGNOSIS — R072 Precordial pain: Secondary | ICD-10-CM | POA: Diagnosis not present

## 2019-04-02 LAB — BASIC METABOLIC PANEL
Anion gap: 10 (ref 5–15)
BUN: 6 mg/dL (ref 6–20)
CO2: 24 mmol/L (ref 22–32)
Calcium: 9 mg/dL (ref 8.9–10.3)
Chloride: 103 mmol/L (ref 98–111)
Creatinine, Ser: 0.71 mg/dL (ref 0.44–1.00)
GFR calc Af Amer: >60 mL/min (ref >60)
GFR calc non Af Amer: >60 mL/min (ref >60)
Glucose, Bld: 97 mg/dL (ref 70–99)
Potassium: 3.5 mmol/L (ref 3.5–5.1)
Sodium: 137 mmol/L (ref 135–145)

## 2019-04-02 LAB — CBC
HCT: 40 % (ref 36.0–46.0)
Hemoglobin: 12.6 g/dL (ref 12.0–15.0)
MCH: 25.9 pg — ABNORMAL LOW (ref 26.0–34.0)
MCHC: 31.5 g/dL (ref 30.0–36.0)
MCV: 82.1 fL (ref 80.0–100.0)
Platelets: 326 10*3/uL (ref 150–400)
RBC: 4.87 MIL/uL (ref 3.87–5.11)
RDW: 15 % (ref 11.5–15.5)
WBC: 6.9 10*3/uL (ref 4.0–10.5)
nRBC: 0 % (ref 0.0–0.2)

## 2019-04-02 LAB — I-STAT BETA HCG BLOOD, ED (MC, WL, AP ONLY): I-stat hCG, quantitative: <5 m[IU]/mL (ref <5)

## 2019-04-02 LAB — TROPONIN I (HIGH SENSITIVITY): Troponin I (High Sensitivity): <2 ng/L (ref <18)

## 2019-04-02 NOTE — ED Triage Notes (Signed)
Pt c/o CP that began last night. Denies SOB and N/V.

## 2019-04-03 LAB — TROPONIN I (HIGH SENSITIVITY): Troponin I (High Sensitivity): <2 ng/L (ref <18)

## 2019-04-03 NOTE — ED Provider Notes (Signed)
[facility] EMERGENCY DEPARTMENT Provider Note  CSN: [id number] Arrival date & time: 04/02/19 1925  Chief Complaint(s) Chest Pain  HPI Julie Compton is a 29 y.o. female   The history is provided by the patient.  Chest Pain Pain location:  Substernal area Pain quality: tightness   Radiates to: left chest. Pain severity:  Moderate Onset quality:  Gradual Duration:  2 days Timing:  Intermittent Progression:  Waxing and waning Chronicity:  New Relieved by: sitting. Exacerbated by: lying down. Associated symptoms: no anxiety, no cough, no fever, no shortness of breath and no vomiting     Past Medical History Past Medical History:  Diagnosis Date  . Herpes genitalis 2015  . History of gestational diabetes (diet-controlled) 07/26/2017   Normal postpartum 2 hr GTT  . Palpitations    There are no problems to display for this patient.  Home Medication(s) Prior to Admission medications   Medication Sig Start Date End Date Taking? Authorizing Provider  ibuprofen (ADVIL,MOTRIN) 600 MG tablet Take 1 tablet (600 mg total) by mouth every 6 (six) hours. 09/26/17   Sandre Kitty, MD  valACYclovir (VALTREX) 1000 MG tablet Take 1 tablet (1,000 mg total) by mouth daily. 08/31/17   Adam Phenix, MD                                                                                                                                    Past Surgical History Past Surgical History:  Procedure Laterality Date  . CESAREAN SECTION N/A 09/24/2017   Procedure: CESAREAN SECTION;  Surgeon: Tereso Newcomer, MD;  Location: [facility];  Service: Obstetrics;  Laterality: N/A;   Family History Family History  Problem Relation Age of Onset  . Diabetes Mother     Social History Social History   Tobacco Use  . Smoking status: Never Smoker  . Smokeless tobacco: Never Used  Substance Use Topics  . Alcohol use: No  . Drug use: No   Allergies Patient has no known  allergies.  Review of Systems Review of Systems  Constitutional: Negative for fever.  Respiratory: Negative for cough and shortness of breath.   Cardiovascular: Positive for chest pain.  Gastrointestinal: Negative for vomiting.   All other systems are reviewed and are negative for acute change except as noted in the HPI  Physical Exam Vital Signs  I have reviewed the triage vital signs BP 115/84   Pulse (!) 58   Temp (!) 97.5 F (36.4 C) (Oral)   Resp 15   SpO2 100%   Physical Exam Vitals reviewed.  Constitutional:      General: She is not in acute distress.    Appearance: She is well-developed. She is not diaphoretic.  HENT:     Head: Normocephalic and atraumatic.     Nose: Nose normal.  Eyes:     General: No scleral icterus.  Right eye: No discharge.        Left eye: No discharge.     Conjunctiva/sclera: Conjunctivae normal.     Pupils: Pupils are equal, round, and reactive to light.  Cardiovascular:     Rate and Rhythm: Normal rate and regular rhythm.     Heart sounds: No murmur. No friction rub. No gallop.   Pulmonary:     Effort: Pulmonary effort is normal. No respiratory distress.     Breath sounds: Normal breath sounds. No stridor. No rales.  Abdominal:     General: There is no distension.     Palpations: Abdomen is soft.     Tenderness: There is no abdominal tenderness.  Musculoskeletal:        General: No tenderness.     Cervical back: Normal range of motion and neck supple.  Skin:    General: Skin is warm and dry.     Findings: No erythema or rash.  Neurological:     Mental Status: She is alert and oriented to person, place, and time.     ED Results and Treatments Labs (all labs ordered are listed, but only abnormal results are displayed) Labs Reviewed  CBC - Abnormal; Notable for the following components:      Result Value   MCH 25.9 (*)    All other components within normal limits  BASIC METABOLIC PANEL  I-STAT BETA HCG BLOOD, ED ([facility],  [facility], [facility] ONLY)  TROPONIN I (HIGH SENSITIVITY)  TROPONIN I (HIGH SENSITIVITY)                                                                                                                         EKG  EKG Interpretation  Date/Time:  Monday April 02 2019 19:35:42 EST Ventricular Rate:  90 PR Interval:  154 QRS Duration: 80 QT Interval:  372 QTC Calculation: 455 R Axis:   61 Text Interpretation: Normal sinus rhythm with sinus arrhythmia Nonspecific T wave abnormality Abnormal ECG Otherwise no significant change Confirmed by Addison Lank [phone]) on 04/03/2019 2:55:46 AM      Radiology DG Chest 2 View  Result Date: 04/02/2019 CLINICAL DATA:  Chest pain since last night. EXAM: CHEST - 2 VIEW COMPARISON:  02/20/2008. FINDINGS: Small left pleural effusion. Normal sized heart. Clear lungs. Normal appearing bones. IMPRESSION: Small left pleural effusion.  Otherwise, normal examination. Electronically Signed   By: Claudie Revering M.D.   On: 04/02/2019 20:03    Pertinent labs & imaging results that were available during my care of the patient were reviewed by me and considered in my medical decision making (see chart for details).  Medications Ordered in ED Medications - No data to display  Procedures Procedures  (including critical care time)  Medical Decision Making / ED Course I have reviewed the nursing notes for this encounter and the patient's prior records (if available in EHR or on provided paperwork).   Julie Compton was evaluated in Emergency Department on 04/03/2019 for the symptoms described in the history of present illness. She was evaluated in the context of the global COVID-19 pandemic, which necessitated consideration that the patient might be at risk for infection with the SARS-CoV-2 virus that causes COVID-19. Institutional protocols and  algorithms that pertain to the evaluation of patients at risk for COVID-19 are in a state of rapid change based on information released by regulatory bodies including the CDC and federal and state organizations. These policies and algorithms were followed during the patient's care in the ED.  Atypical pain consistent with ACS.  EKG without acute ischemic changes or evidence of pericardial rest.  Serial troponins in triage were negative.  Feel this is sufficient to rule out ACS.  Low suspicion for pulmonary embolism patient is PERC negative.  Presentation not classic for aortic dissection or esophageal perforation.   Chest x-ray without evidence suggestive of pneumonia, pneumothorax, pneumomediastinum.  No abnormal contour of the mediastinum to suggest dissection. No evidence of acute injuries.  Most likely GI related based on patient's symptoms.  Currently she is asymptomatic.  Discussed symptomatic management.  The patient appears reasonably screened and/or stabilized for discharge and I doubt any other medical condition or other [facility] requiring further screening, evaluation, or treatment in the ED at this time prior to discharge.  The patient is safe for discharge with strict return precautions.       Final Clinical Impression(s) / ED Diagnoses Final diagnoses:  Substernal precordial chest pain     The patient appears reasonably screened and/or stabilized for discharge and I doubt any other medical condition or other [facility] requiring further screening, evaluation, or treatment in the ED at this time prior to discharge.  Disposition: Discharge  Condition: Good  I have discussed the results, Dx and Tx plan with the patient who expressed understanding and agree(s) with the plan. Discharge instructions discussed at great length. The patient was given strict return precautions who verbalized understanding of the instructions. No further questions at time of discharge.    ED Discharge Orders      None       Follow Up: Primary care provider  Schedule an appointment as soon as possible for a visit  If you do not have a primary care physician, contact [facility] at [phone] for referral     This chart was dictated using voice recognition software.  Despite best efforts to proofread,  errors can occur which can change the documentation meaning.   Nira Conn, MD 04/03/19 0425

## 2019-04-03 NOTE — ED Notes (Signed)
Patient verbalizes understanding of discharge instructions. Opportunity for questioning and answers were provided. Armband removed by staff, pt discharged from ED.  

## 2019-09-01 IMAGING — US US MFM OB DETAIL+14 WK
1 series · 14 of 28 positions shown · non-contrast
Comparison: none

[Series 1: us mfm ob detail+14 wk · 70 acquisitions, 14 frames shown]
[im 3/70]
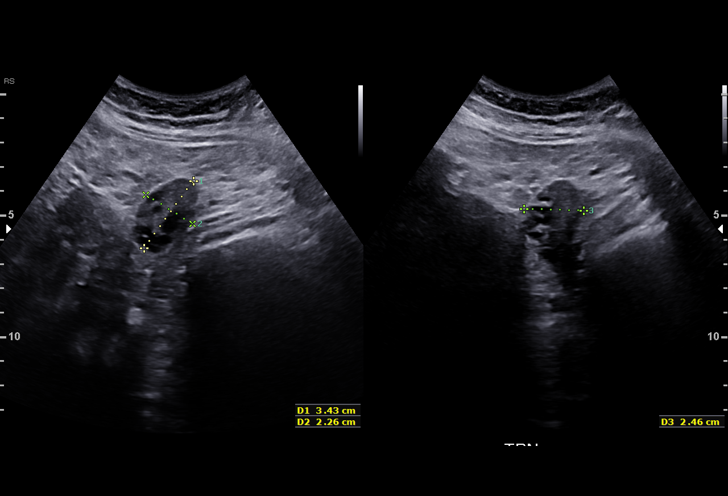
[im 8/70]
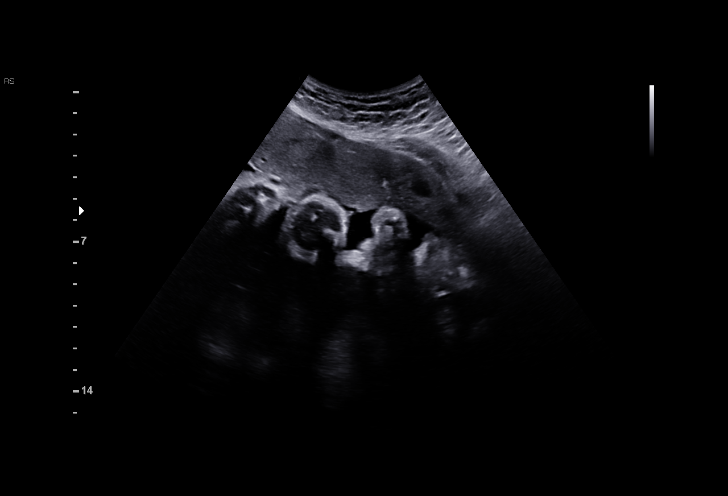
[im 13/70]
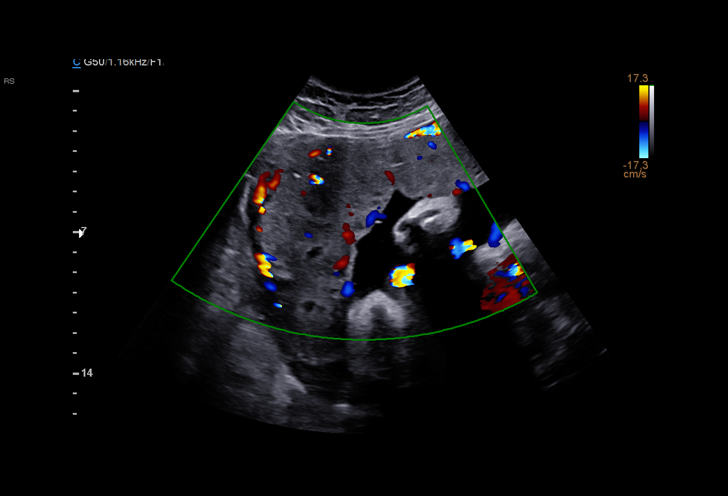
[im 18/70]
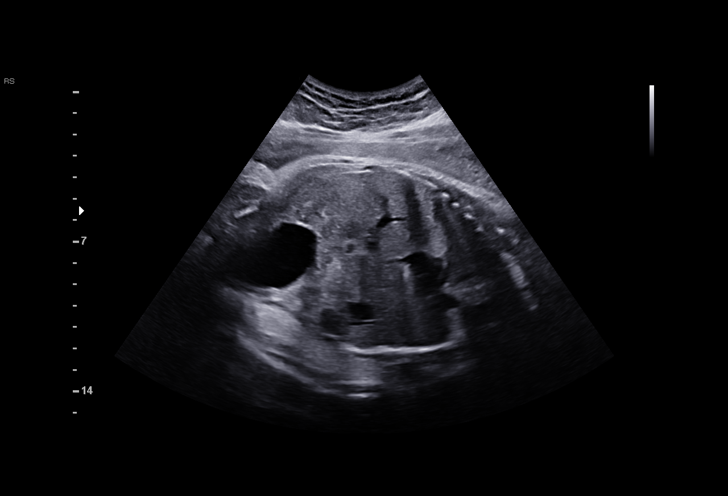
[im 24/70]
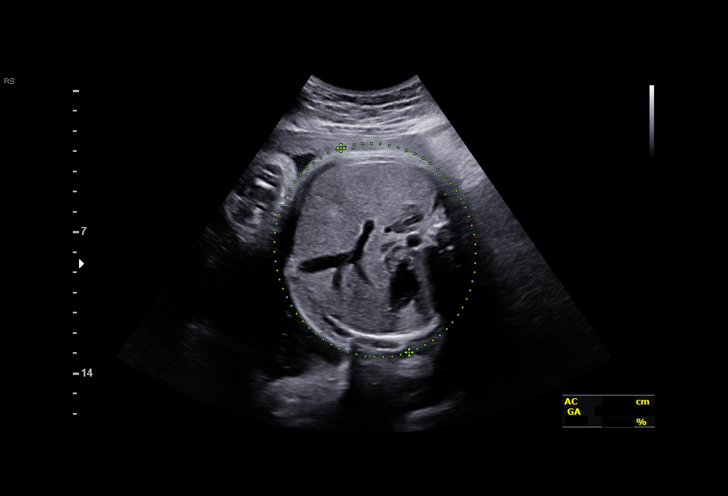
[im 29/70]
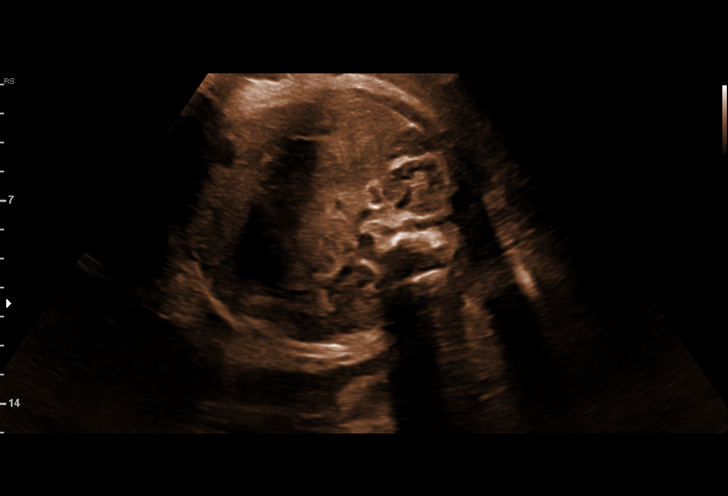
[im 34/70]
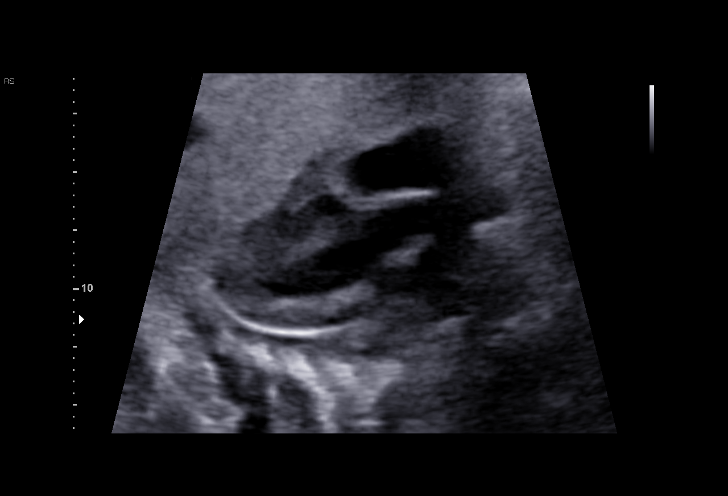
[im 39/70]
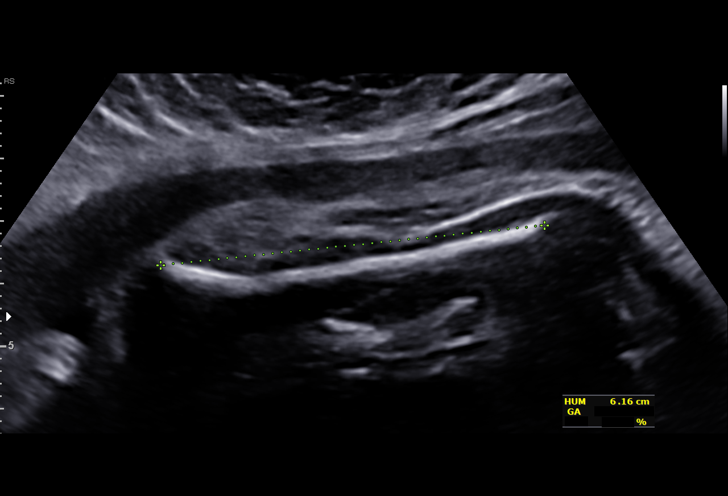
[im 44/70]
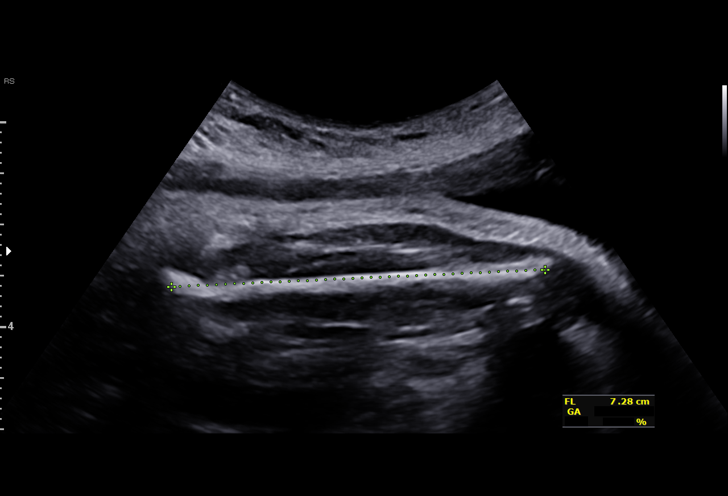
[im 49/70]
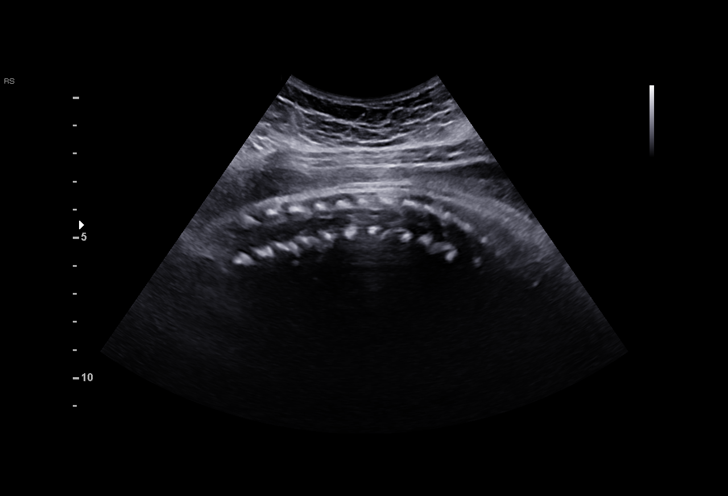
[im 54/70]
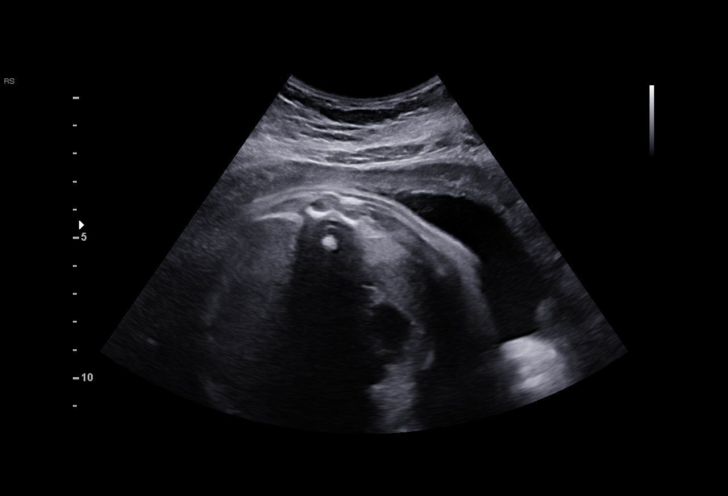
[im 59/70]
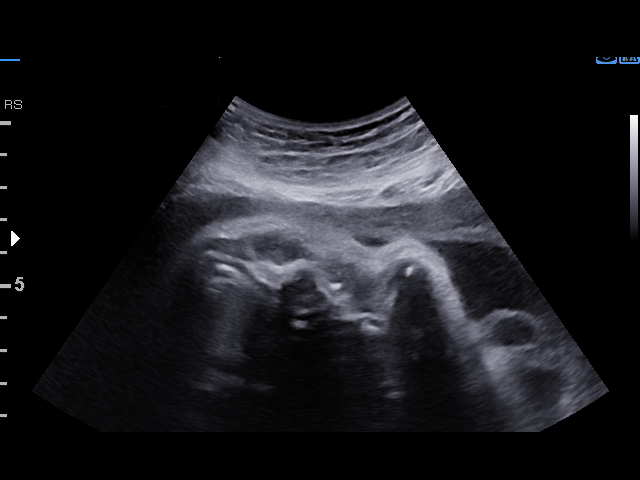
[im 64/70]
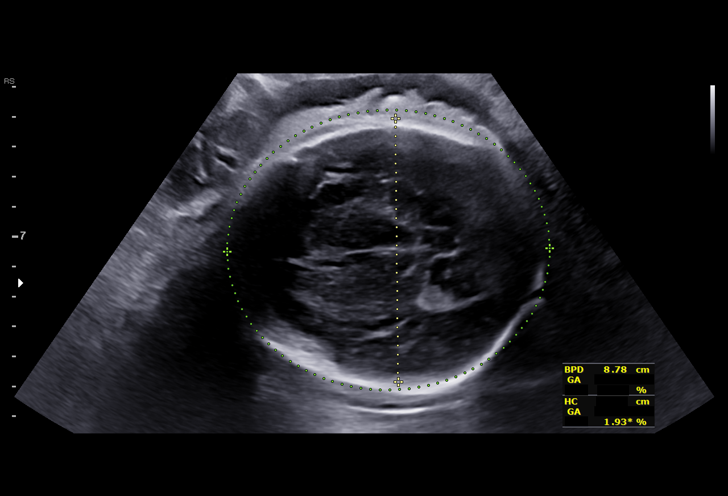
[im 70/70]
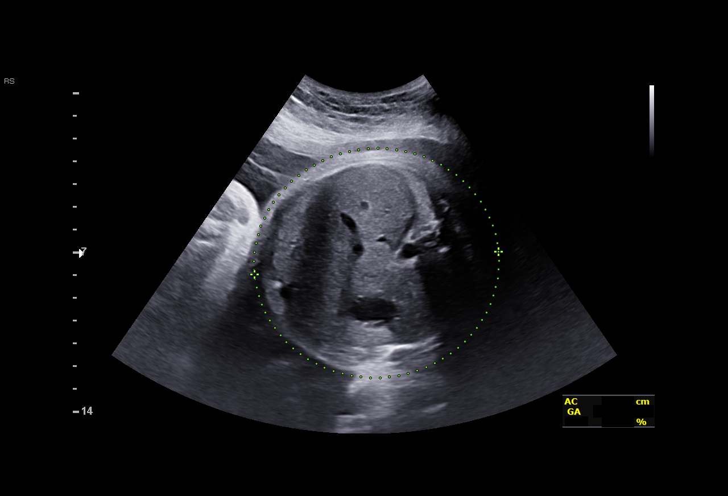

[14 of 28 positions shown; findings below may reference images not displayed]

Health
TIGER NP

1  STEFANOU MAAN             [id number]      [id number]     [id number]
Indications

37 weeks gestation of pregnancy
Gestational diabetes in pregnancy, diet
controlled
Encounter for antenatal screening for
malformations
OB History

Blood Type:            Height:  5'5"   Weight (lb):  199       BMI:
Gravidity:    1
Fetal Evaluation

Num Of Fetuses:     1
Fetal Heart         145
Rate(bpm):
Cardiac Activity:   Observed
Presentation:       Cephalic
Placenta:           Anterior Fundal, above cervical os
P. Cord Insertion:  Visualized

Amniotic Fluid
AFI FV:      Subjectively within normal limits

AFI Sum(cm)     %Tile       Largest Pocket(cm)
18.32           71

RUQ(cm)       RLQ(cm)       LUQ(cm)        LLQ(cm)
4.17
Biometry

BPD:      87.2  mm     G. Age:  35w 1d         10  %    CI:        74.33   %    70 - 86
FL/HC:      22.1   %    20.9 -
HC:      321.1  mm     G. Age:  36w 2d          7  %    HC/AC:      0.99        0.92 -
AC:      322.8  mm     G. Age:  36w 1d         24  %    FL/BPD:     81.3   %    71 - 87
FL:       70.9  mm     G. Age:  36w 2d         20  %    FL/AC:      22.0   %    20 - 24
HUM:      61.4  mm     G. Age:  35w 4d         35  %
CER:      53.2  mm     G. Age:  N/A          > 95  %

CM:       10.7  mm

Est. FW:    9370  gm      6 lb 5 oz     38  %
Gestational Age

LMP:           37w 5d        Date:  12/17/16                 EDD:   09/23/17
U/S Today:     36w 0d                                        EDD:   10/05/17
Best:          37w 5d     Det. By:  LMP  (12/17/16)          EDD:   09/23/17
Anatomy

Cranium:               Appears normal         Aortic Arch:            Not well visualized
Cavum:                 Not well visualized    Ductal Arch:            Not well visualized
Ventricles:            Not well visualized    Diaphragm:              Appears normal
Choroid Plexus:        Appears normal         Stomach:                Appears normal, left
sided
Cerebellum:            Appears normal         Abdomen:                Appears normal
Posterior Fossa:       Not well visualized    Abdominal Wall:         Not well visualized
Nuchal Fold:           Not applicable (>20    Cord Vessels:           Appears normal (3
wks GA)                                        vessel cord)
Face:                  Not well visualized    Kidneys:                Appear normal
Lips:                  Not well visualized    Bladder:                Appears normal
Thoracic:              Appears normal         Spine:                  Limited views
appear normal
Heart:                 Not well visualized    Upper Extremities:      Visualized
RVOT:                  Appears normal         Lower Extremities:      Visualized
LVOT:                  Appears normal

Other:  Fetus appears to be a female. Technically difficult due to advanced
gestational age.
Cervix Uterus Adnexa

Cervix
Not visualized (advanced GA >10wks)

Uterus
No abnormality visualized.

Left Ovary
Within normal limits.

Right Ovary
Not visualized.

Adnexa:       No abnormality visualized. No adnexal mass
visualized.
Impression

Single living intrauterine pregnancy at 37w 5d.
Placenta Anterior Fundal, above cervical os.
Appropriate fetal growth.
Normal amniotic fluid volume.
The fetal anatomic survey is not complete.
No gross fetal anomalies identified.
The adnexa appear normal bilaterally without masses.
Recommendations

Follow-up ultrasounds as clinically indicated.

## 2020-08-04 ENCOUNTER — Emergency Department (HOSPITAL_COMMUNITY)
Admission: EM | Admit: 2020-08-04 | Discharge: 2020-08-04 | Disposition: A | Discharge status: Home / Self Care | Payer: BLUE CROSS/BLUE SHIELD | Attending: Emergency Medicine | Admitting: Emergency Medicine

## 2020-08-04 ENCOUNTER — Encounter (HOSPITAL_COMMUNITY): Payer: Self-pay

## 2020-08-04 ENCOUNTER — Other Ambulatory Visit: Payer: Self-pay

## 2020-08-04 DIAGNOSIS — K0889 Other specified disorders of teeth and supporting structures: Secondary | ICD-10-CM | POA: Insufficient documentation

## 2020-08-04 MED ORDER — NAPROXEN 500 MG PO TABS: 500.0000 mg | ORAL_TABLET | Freq: Two times a day (BID) | ORAL | 0 refills | Status: AC | PRN

## 2020-08-04 MED ORDER — PENICILLIN V POTASSIUM 500 MG PO TABS: 500.0000 mg | ORAL_TABLET | Freq: Four times a day (QID) | ORAL | 0 refills | Status: AC

## 2020-08-04 MED ORDER — PENICILLIN V POTASSIUM 500 MG PO TABS: 500.0000 mg | ORAL_TABLET | Freq: Four times a day (QID) | ORAL | 0 refills | Status: DC

## 2020-08-04 NOTE — Discharge Instructions (Signed)
Call one of the dentists offices provided to schedule an appointment for re-evaluation and further management within the next 48 hours.   I have prescribed you Penicillin which is an antibiotic to treat the infection and Naproxen which is an anti-inflammatory medicine to treat the pain.   Please take all of your antibiotics until finished. You may develop abdominal discomfort or diarrhea from the antibiotic.  You may help offset this with probiotics which you can buy at the store (ask your pharmacist if unable to find) or get probiotics in the form of eating yogurt. Do not eat or take the probiotics until 2 hours after your antibiotic. If you are unable to tolerate these side effects follow-up with your primary care provider or return to the emergency department.   If you begin to experience any blistering, rashes, swelling, or difficulty breathing seek medical care for evaluation of potentially more serious side effects.   Be sure to eat something when taking the Naproxen as it can cause stomach upset and at worst stomach bleeding. Do not take additional non steroidal anti-inflammatory medicines such as Ibuprofen, Aleve, Advil, Mobic, Diclofenac, or goodie powder while taking Naproxen as these are similar medicines. You may supplement with Tylenol.   We have prescribed you new medication(s) today. Discuss the medications prescribed today with your pharmacist as they can have adverse effects and interactions with your other medicines including over the counter and prescribed medications. Seek medical evaluation if you start to experience new or abnormal symptoms after taking one of these medicines, seek care immediately if you start to experience difficulty breathing, feeling of your throat closing, facial swelling, or rash as these could be indications of a more serious allergic reaction  If you start to experience and new or worsening symptoms return to the emergency department. If you start to  experience fever, chills, neck stiffness/pain, or inability to move your neck or open your mouth come back to the emergency department immediately.

## 2020-08-04 NOTE — ED Provider Notes (Signed)
[facility]-EMERGENCY DEPT Provider Note   CSN: [id number] Arrival date & time: 08/04/20  2221     History Chief Complaint  Patient presents with  . Dental Pain    Julie Compton is a 30 y.o. female without significant past medical hx who presents to the ED with complaints of dental pain x 1 month which has been progressively worsening. Patient states that she cracked her right lower wisdom tooth a little while ago, but was not having much issues with it until about 1 month ago. Pain to the area is constant, progressively worsening, no alleviating/aggravating factors. Trying ibuprofen without much change. Was going to see a dentist but lost her job but is re-starting soon. Denies fever, chills, emesis, dyspnea, dysphagia, pain/swelling beneath the tongue, or sore throat. Patient denies chance of pregnancy.   HPI     Past Medical History:  Diagnosis Date  . Herpes genitalis 2015  . History of gestational diabetes (diet-controlled) 07/26/2017   Normal postpartum 2 hr GTT  . Palpitations     There are no problems to display for this patient.   Past Surgical History:  Procedure Laterality Date  . CESAREAN SECTION N/A 09/24/2017   Procedure: CESAREAN SECTION;  Surgeon: Tereso Newcomer, MD;  Location: [facility];  Service: Obstetrics;  Laterality: N/A;     OB History    Gravida  1   Para  1   Term  1   Preterm      AB      Living  1     SAB      IAB      Ectopic      Multiple  0   Live Births  1           Family History  Problem Relation Age of Onset  . Diabetes Mother     Social History   Tobacco Use  . Smoking status: Never Smoker  . Smokeless tobacco: Never Used  Substance Use Topics  . Alcohol use: No  . Drug use: No    Home Medications Prior to Admission medications   Medication Sig Start Date End Date Taking? Authorizing Provider  ibuprofen (ADVIL,MOTRIN) 600 MG tablet Take 1 tablet (600 mg total) by  mouth every 6 (six) hours. 09/26/17   Sandre Kitty, MD  valACYclovir (VALTREX) 1000 MG tablet Take 1 tablet (1,000 mg total) by mouth daily. 08/31/17   Adam Phenix, MD    Allergies    Patient has no known allergies.  Review of Systems   Review of Systems  Constitutional: Negative for chills and fever.  HENT: Positive for dental problem. Negative for sore throat, trouble swallowing and voice change.   Respiratory: Negative for shortness of breath.   Cardiovascular: Negative for chest pain.  Gastrointestinal: Negative for abdominal pain and vomiting.  Neurological: Negative for syncope.  All other systems reviewed and are negative.   Physical Exam Updated Vital Signs BP (!) 164/98 (BP Location: Right Arm)   Pulse 79   Temp 98.6 F (37 C) (Oral)   Resp 18   SpO2 100%   Physical Exam Vitals and nursing note reviewed.  Constitutional:      General: She is not in acute distress.    Appearance: She is well-developed. She is not toxic-appearing.  HENT:     Head: Normocephalic and atraumatic.     Right Ear: Tympanic membrane is not perforated, erythematous, retracted or bulging.     Left  Ear: Tympanic membrane is not perforated, erythematous, retracted or bulging.     Nose: Nose normal.     Mouth/Throat:     Pharynx: Uvula midline. No oropharyngeal exudate, posterior oropharyngeal erythema or uvula swelling.     Tonsils: No tonsillar abscesses.      Comments: Posterior oropharynx is symmetric appearing. Patient tolerating own secretions without difficulty. No trismus. No drooling. No hot potato voice. No swelling beneath the tongue, submandibular compartment is soft.  Eyes:     General:        Right eye: No discharge.        Left eye: No discharge.     Conjunctiva/sclera: Conjunctivae normal.  Musculoskeletal:     Cervical back: Normal range of motion and neck supple. No edema, erythema, rigidity or crepitus.  Lymphadenopathy:     Cervical: No cervical adenopathy.   Neurological:     Mental Status: She is alert.  Psychiatric:        Behavior: Behavior normal.        Thought Content: Thought content normal.    ED Results / Procedures / Treatments   Labs (all labs ordered are listed, but only abnormal results are displayed) Labs Reviewed - No data to display  EKG None  Radiology No results found.  Procedures Procedures   Medications Ordered in ED Medications - No data to display  ED Course  I have reviewed the triage vital signs and the nursing notes.  Pertinent labs & imaging results that were available during my care of the patient were reviewed by me and considered in my medical decision making (see chart for details).    MDM Rules/Calculators/A&P                         Patient presents with dental pain. Patient is nontoxic appearing, vitals without significant abnormality, BP elevated, doubt HTN emergency. No gross abscess.  Exam unconcerning for Ludwig's angina or deep space infection currently.  Will treat with Pen VK and Naproxen.  Urged patient to follow-up with dentist, dental resources were provided.  Discussed treatment plan and need for follow up as well as return precautions. Provided opportunity for questions, patient confirmed understanding and is agreeable to plan.  Final Clinical Impression(s) / ED Diagnoses Final diagnoses:  None    Rx / DC Orders ED Discharge Orders         Ordered    penicillin v potassium (VEETID) 500 MG tablet  4 times daily,   Status:  Discontinued        08/04/20 2257    naproxen (NAPROSYN) 500 MG tablet  2 times daily PRN        08/04/20 2257    penicillin v potassium (VEETID) 500 MG tablet  4 times daily        08/04/20 [address] PA-C 08/04/20 2300    Alvira Monday, MD 08/05/20 1343

## 2020-08-04 NOTE — ED Triage Notes (Signed)
Pt c/o dental pain and possible abscess. Pt states she has been taking ibuprofen 3 times a day for a month. Pt states she is here due to pain.

## 2024-03-24 ENCOUNTER — Encounter (HOSPITAL_BASED_OUTPATIENT_CLINIC_OR_DEPARTMENT_OTHER): Payer: Self-pay

## 2024-03-24 ENCOUNTER — Emergency Department (HOSPITAL_BASED_OUTPATIENT_CLINIC_OR_DEPARTMENT_OTHER)

## 2024-03-24 ENCOUNTER — Emergency Department (HOSPITAL_BASED_OUTPATIENT_CLINIC_OR_DEPARTMENT_OTHER)
Admission: EM | Admit: 2024-03-24 | Discharge: 2024-03-24 | Disposition: A | Discharge status: Home / Self Care | Attending: Emergency Medicine | Admitting: Emergency Medicine

## 2024-03-24 ENCOUNTER — Other Ambulatory Visit: Payer: Self-pay

## 2024-03-24 DIAGNOSIS — K8070 Calculus of gallbladder and bile duct without cholecystitis without obstruction: Secondary | ICD-10-CM | POA: Diagnosis not present

## 2024-03-24 DIAGNOSIS — R1013 Epigastric pain: Secondary | ICD-10-CM | POA: Diagnosis present

## 2024-03-24 LAB — COMPREHENSIVE METABOLIC PANEL WITH GFR
ALT: 12 U/L (ref 0–44)
AST: 15 U/L (ref 15–41)
Albumin: 4.2 g/dL (ref 3.5–5.0)
Alkaline Phosphatase: 51 U/L (ref 38–126)
Anion gap: 11 (ref 5–15)
BUN: 13 mg/dL (ref 6–20)
CO2: 25 mmol/L (ref 22–32)
Calcium: 8.9 mg/dL (ref 8.9–10.3)
Chloride: 102 mmol/L (ref 98–111)
Creatinine, Ser: 0.71 mg/dL (ref 0.44–1.00)
GFR, Estimated: >60 mL/min
Glucose, Bld: 137 mg/dL — ABNORMAL HIGH (ref 70–99)
Potassium: 3.8 mmol/L (ref 3.5–5.1)
Sodium: 138 mmol/L (ref 135–145)
Total Bilirubin: 0.3 mg/dL (ref 0.0–1.2)
Total Protein: 7 g/dL (ref 6.5–8.1)

## 2024-03-24 LAB — CBC
HCT: 38.2 % (ref 36.0–46.0)
Hemoglobin: 12.3 g/dL (ref 12.0–15.0)
MCH: 26.8 pg (ref 26.0–34.0)
MCHC: 32.2 g/dL (ref 30.0–36.0)
MCV: 83.2 fL (ref 80.0–100.0)
Platelets: 335 K/uL (ref 150–400)
RBC: 4.59 MIL/uL (ref 3.87–5.11)
RDW: 14.2 % (ref 11.5–15.5)
WBC: 7.6 K/uL (ref 4.0–10.5)
nRBC: 0 % (ref 0.0–0.2)

## 2024-03-24 LAB — URINALYSIS, ROUTINE W REFLEX MICROSCOPIC
Bilirubin Urine: NEGATIVE
Glucose, UA: NEGATIVE mg/dL
Hgb urine dipstick: NEGATIVE
Ketones, ur: NEGATIVE mg/dL
Leukocytes,Ua: NEGATIVE
Nitrite: NEGATIVE
Protein, ur: NEGATIVE mg/dL
Specific Gravity, Urine: 1.025 (ref 1.005–1.030)
pH: 7 (ref 5.0–8.0)

## 2024-03-24 LAB — TROPONIN T, HIGH SENSITIVITY
Troponin T High Sensitivity: <15 ng/L (ref 0–19)
Troponin T High Sensitivity: <15 ng/L (ref 0–19)

## 2024-03-24 LAB — LIPASE, BLOOD: Lipase: 42 U/L (ref 11–51)

## 2024-03-24 LAB — HCG, SERUM, QUALITATIVE: Preg, Serum: NEGATIVE

## 2024-03-24 MED ORDER — KETOROLAC TROMETHAMINE 15 MG/ML IJ SOLN
15.0000 mg | Freq: Once | INTRAMUSCULAR | Status: AC
  Administered 2024-03-24: 15 mg via INTRAVENOUS
  Filled 2024-03-24: qty 1

## 2024-03-24 MED ORDER — FENTANYL CITRATE (PF) 50 MCG/ML IJ SOSY
25.0000 ug | PREFILLED_SYRINGE | Freq: Once | INTRAMUSCULAR | Status: AC
  Administered 2024-03-24: 25 ug via INTRAVENOUS
  Filled 2024-03-24: qty 1

## 2024-03-24 MED ORDER — ALUM & MAG HYDROXIDE-SIMETH 200-200-20 MG/5ML PO SUSP
30.0000 mL | Freq: Once | ORAL | Status: AC
  Administered 2024-03-24: 30 mL via ORAL
  Filled 2024-03-24: qty 30

## 2024-03-24 MED ORDER — ONDANSETRON 4 MG PO TBDP: 4.0000 mg | ORAL_TABLET | Freq: Three times a day (TID) | ORAL | 0 refills | Status: AC | PRN

## 2024-03-24 MED ORDER — FAMOTIDINE 20 MG PO TABS
20.0000 mg | ORAL_TABLET | Freq: Once | ORAL | Status: AC
  Administered 2024-03-24: 20 mg via ORAL
  Filled 2024-03-24: qty 1

## 2024-03-24 MED ORDER — IOHEXOL 300 MG/ML  SOLN
100.0000 mL | Freq: Once | INTRAMUSCULAR | Status: AC | PRN
  Administered 2024-03-24: 100 mL via INTRAVENOUS

## 2024-03-24 MED ORDER — OXYCODONE HCL 5 MG PO TABS: 5.0000 mg | ORAL_TABLET | ORAL | 0 refills | Status: AC | PRN

## 2024-03-24 MED ORDER — ONDANSETRON HCL 4 MG/2ML IJ SOLN
4.0000 mg | Freq: Once | INTRAMUSCULAR | Status: AC
  Administered 2024-03-24: 4 mg via INTRAVENOUS
  Filled 2024-03-24: qty 2

## 2024-03-24 MED ORDER — LIDOCAINE VISCOUS HCL 2 % MT SOLN
15.0000 mL | Freq: Once | OROMUCOSAL | Status: AC
  Administered 2024-03-24: 15 mL via ORAL
  Filled 2024-03-24: qty 15

## 2024-03-24 NOTE — ED Provider Notes (Signed)
 " [facility] Provider Note   CSN: [id number] Arrival date & time: 03/24/24  8162     Patient presents with: Abdominal Pain   Julie Compton is a 34 y.o. female.  Patient with past history significant for palpitations who presents to the emergency department with concerns of abdominal pain.  Reports she had onset of sharp and severe epigastric abdominal pain shortly after eating today.  States that the pain started on 5:30 PM and is slightly diminished from a level 8 to a 7.  She had some diarrhea at the time of this pain but denies any significant nausea, vomiting, or fevers.  She reports nausea has largely improved.  She does also endorse that she has been chest pain earlier today that last about 10 minutes but this is largely resolved.  No shortness of breath.  Denies any fever, chills or bodyaches.  No sick contacts.   Abdominal Pain      Prior to Admission medications  Medication Sig Start Date End Date Taking? Authorizing Provider  ondansetron  (ZOFRAN -ODT) 4 MG disintegrating tablet Take 1 tablet (4 mg total) by mouth every 8 (eight) hours as needed for nausea or vomiting. 03/24/24  Yes Kwamane Whack A, PA-C  oxyCODONE  (ROXICODONE ) 5 MG immediate release tablet Take 1 tablet (5 mg total) by mouth every 4 (four) hours as needed for severe pain (pain score 7-10). 03/24/24  Yes Eniyah Eastmond A, PA-C  naproxen  (NAPROSYN ) 500 MG tablet Take 1 tablet (500 mg total) by mouth 2 (two) times daily as needed for moderate pain. 08/04/20   Petrucelli, Samantha R, PA-C  valACYclovir  (VALTREX ) 1000 MG tablet Take 1 tablet (1,000 mg total) by mouth daily. 08/31/17   Eveline Lynwood MATSU, MD    Allergies: Patient has no known allergies.    Review of Systems  Gastrointestinal:  Positive for abdominal pain.  All other systems reviewed and are negative.   Updated Vital Signs BP 119/87 (BP Location: Right Arm)   Pulse (!) 59   Temp 98.3 F (36.8 C) (Oral)    Resp 20   Ht 5' 6 (1.676 m)   Wt 93 kg   LMP 03/06/2024 (Approximate)   SpO2 100%   BMI 33.09 kg/m   Physical Exam Vitals and nursing note reviewed.  Constitutional:      General: She is not in acute distress.    Appearance: She is well-developed.  HENT:     Head: Normocephalic and atraumatic.  Eyes:     Conjunctiva/sclera: Conjunctivae normal.  Cardiovascular:     Rate and Rhythm: Normal rate and regular rhythm.     Heart sounds: No murmur heard. Pulmonary:     Effort: Pulmonary effort is normal. No respiratory distress.     Breath sounds: Normal breath sounds. No wheezing, rhonchi or rales.  Abdominal:     General: There is no distension.     Palpations: Abdomen is soft.     Tenderness: There is abdominal tenderness in the epigastric area. There is no guarding or rebound.  Musculoskeletal:        General: No swelling.     Cervical back: Neck supple.  Skin:    General: Skin is warm and dry.     Capillary Refill: Capillary refill takes less than 2 seconds.  Neurological:     Mental Status: She is alert.  Psychiatric:        Mood and Affect: Mood normal.     (all labs ordered  are listed, but only abnormal results are displayed) Labs Reviewed  COMPREHENSIVE METABOLIC PANEL WITH GFR - Abnormal; Notable for the following components:      Result Value   Glucose, Bld 137 (*)    All other components within normal limits  CBC  LIPASE, BLOOD  URINALYSIS, ROUTINE W REFLEX MICROSCOPIC  HCG, SERUM, QUALITATIVE  TROPONIN T, HIGH SENSITIVITY  TROPONIN T, HIGH SENSITIVITY    EKG: EKG Interpretation Date/Time:  Saturday March 24 2024 18:50:25 EST Ventricular Rate:  87 PR Interval:  148 QRS Duration:  87 QT Interval:  368 QTC Calculation: 443 R Axis:   71  Text Interpretation: Sinus rhythm No significant change since last tracing Confirmed by Patt Alm DEL [id number]) on 03/24/2024 6:53:04 PM  Radiology: US  Abdomen Limited RUQ (LIVER/GB) Result Date:  03/24/2024 CLINICAL DATA:  Right upper quadrant pain EXAM: ULTRASOUND ABDOMEN LIMITED RIGHT UPPER QUADRANT COMPARISON:  03/24/2024 FINDINGS: Gallbladder: Gallbladder is moderately distended. Multiple shadowing gallstones are identified measuring up to 1.9 cm. There is a 1.1 cm calculus within the gallbladder neck. No gallbladder wall thickening or pericholecystic fluid. Murphy sign cannot be assessed as the patient was given pain medication prior to the exam. Common bile duct: Diameter: 4 mm Liver: 1.6 x 1.4 x 1.5 cm hyperechoic focus within the left lobe liver corresponds to a hemangioma seen on CT. Otherwise the liver is unremarkable. No intrahepatic biliary duct dilation. Portal vein is patent on color Doppler imaging with normal direction of blood flow towards the liver. Other: None. IMPRESSION: 1. Cholelithiasis, with no sonographic evidence of acute cholecystitis. 2. 1.6 cm hyperechoic focus left lobe liver, consistent with hemangioma. This corresponds to the hypodensity seen on CT. Electronically Signed   By: Ozell Daring M.D.   On: 03/24/2024 21:39   CT ABDOMEN PELVIS W CONTRAST Result Date: 03/24/2024 CLINICAL DATA:  Abdominal pain, epigastric pain since 5:30 p.m., diarrhea EXAM: CT ABDOMEN AND PELVIS WITH CONTRAST TECHNIQUE: Multidetector CT imaging of the abdomen and pelvis was performed using the standard protocol following bolus administration of intravenous contrast. RADIATION DOSE REDUCTION: This exam was performed according to the departmental dose-optimization program which includes automated exposure control, adjustment of the mA and/or kV according to patient size and/or use of iterative reconstruction technique. CONTRAST:  OMNIPAQUE  IOHEXOL  300 MG/ML  SOLN COMPARISON:  None Available. FINDINGS: Lower chest: No acute pleural or parenchymal lung disease. Hepatobiliary: Noncalcified gallstones are identified without evidence of acute cholecystitis. Indeterminate hypodensity within the  left lobe liver measuring 1.5 cm reference image 16/2, favor hemangioma. No biliary duct dilation. Pancreas: Unremarkable. No pancreatic ductal dilatation or surrounding inflammatory changes. Spleen: Normal in size without focal abnormality. Adrenals/Urinary Tract: Adrenal glands are unremarkable. Kidneys are normal, without renal calculi, focal lesion, or hydronephrosis. Bladder is unremarkable. Stomach/Bowel: No bowel obstruction or ileus. Normal appendix right lower quadrant. No bowel wall thickening or inflammatory change. Vascular/Lymphatic: No significant vascular findings are present. No enlarged abdominal or pelvic lymph nodes. Reproductive: 1.2 cm hyperdense area within the uterine body consistent with fibroid. Otherwise the uterus and adnexal structures are unremarkable. Other: No free fluid or free intraperitoneal gas. No abdominal wall hernia. Musculoskeletal: No acute or destructive bony abnormalities. Reconstructed images demonstrate no additional findings. IMPRESSION: 1. Cholelithiasis without cholecystitis. 2. Small uterine fibroid. 3. Indeterminate 1.5 cm left lobe liver hypodensity, favor hemangioma. Nonemergent follow-up ultrasound could be considered. Electronically Signed   By: Ozell Daring M.D.   On: 03/24/2024 20:28   DG Chest 2 View Result  Date: 03/24/2024 EXAM: 2 VIEW(S) XRAY OF THE CHEST 03/24/2024 07:10:00 PM COMPARISON: Chest x-ray 04/02/2019. CLINICAL HISTORY: cp FINDINGS: LUNGS AND PLEURA: No focal pulmonary opacity. No pleural effusion. No pneumothorax. HEART AND MEDIASTINUM: No acute abnormality of the cardiac and mediastinal silhouettes. BONES AND SOFT TISSUES: No acute osseous abnormality. IMPRESSION: 1. No acute cardiopulmonary abnormality. Electronically signed by: Morgane Naveau MD MD 03/24/2024 07:18 PM EST RP Workstation: [id number]    Procedures   Medications Ordered in the ED  alum & mag hydroxide-simeth (MAALOX/MYLANTA) 200-200-20 MG/5ML suspension 30 mL (30 mLs  Oral Given 03/24/24 1916)    And  lidocaine  (XYLOCAINE ) 2 % viscous mouth solution 15 mL (15 mLs Oral Given 03/24/24 1916)  famotidine  (PEPCID ) tablet 20 mg (20 mg Oral Given 03/24/24 1916)  ondansetron  (ZOFRAN ) injection 4 mg (4 mg Intravenous Given 03/24/24 1935)  fentaNYL  (SUBLIMAZE ) injection 25 mcg (25 mcg Intravenous Given 03/24/24 1953)  iohexol  (OMNIPAQUE ) 300 MG/ML solution 100 mL (100 mLs Intravenous Contrast Given 03/24/24 2011)  ketorolac  (TORADOL ) 15 MG/ML injection 15 mg (15 mg Intravenous Given 03/24/24 2100)                                    Medical Decision Making Amount and/or Complexity of Data Reviewed Labs: ordered. Radiology: ordered.  Risk OTC drugs. Prescription drug management.   This patient presents to the ED for concern of abdominal pain, this involves an extensive number of treatment options, and is a complaint that carries with it a high risk of complications and morbidity.  The differential diagnosis includes pancreatitis, GERD, cholecystitis, ACS, dehydration   Co morbidities that complicate the patient evaluation  Palpitations, history of gestational diabetes   Additional history obtained:  Additional history obtained from chart review   Lab Tests:  I Ordered, and personally interpreted labs.  The pertinent results include: CBC unremarkable.  CMP mild hyperglycemia 137, lipase unremarkable at 42, troponin negative at less than 15, UA negative for infection, hCG negative   Imaging Studies ordered:  I ordered imaging studies including chest x-ray, CT of the pelvis, right upper quadrant ultrasound I independently visualized and interpreted imaging which showed no acute cardiopulmonary process, Cholelithiasis without cholecystitis. 2. Small uterine fibroid. 3. Indeterminate 1.5 cm left lobe liver hypodensity, favor hemangioma. Nonemergent follow-up ultrasound could be considered.Cholelithiasis, with no sonographic evidence of acute cholecystitis. 2.  1.6 cm hyperechoic focus left lobe liver, consistent with hemangioma. This corresponds to the hypodensity seen on CT. I agree with the radiologist interpretation   Cardiac Monitoring: / EKG:  The patient was maintained on a cardiac monitor.  I personally viewed and interpreted the cardiac monitored which showed an underlying rhythm of: Sinus rhythm   Consultations Obtained:  I requested consultation with none,  and discussed lab and imaging findings as well as pertinent plan - they recommend: N/A   Problem List / ED Course / Critical interventions / Medication management  Patient presents to the ED with concerns of abdominal pain, nausea, and vomiting. States that symptoms began rather quickly after eating dinner this evening with pain remaining in the upper and central abdomen/chest. No chest pain or shortness of breath. History of GERD and no medications attempted prior to arrival. Exam reveals focal epigastric tenderness. No other area of significant pain or discomfort. Abdomen is soft and non-distended. Based on area of pain, primarily considering GERD vs pancreatitis vs gallbladder. Will attempt GI cocktail as results  return. Patient vomiting. Added on CTAP due to poorly controlled pain at this time. CTAP concerning for cholelithiasis without cholecystitis. Will obtain US  for further detailed assessment of RUQ. US  shows cholelithiasis without cholecystitis. Reassessed and patient found to have resolution of pain. She is requesting discharge. I advised her that she would benefit from follow up with general surgery given stones in gallbladder and high risk of recurrent pain or worsening symptoms. Return precautions advised. She is stable for outpatient follow up and discharged home. I ordered medication including Maalox, famotidine , viscous lidocaine , Zofran , fentanyl , Toradol  for GI cocktail, nausea, pain Reevaluation of the patient after these medicines showed that the patient  improved I have reviewed the patients home medicines and have made adjustments as needed   Social Determinants of Health:  None   Test / Admission - Considered:  Considered but stable for outpatient follow-up.  Final diagnoses:  Calculus of gallbladder and bile duct without cholecystitis or obstruction    ED Discharge Orders          Ordered    oxyCODONE  (ROXICODONE ) 5 MG immediate release tablet  Every 4 hours PRN        03/24/24 2208    ondansetron  (ZOFRAN -ODT) 4 MG disintegrating tablet  Every 8 hours PRN        03/24/24 2209    Ambulatory referral to General Surgery        03/24/24 2210               Cecily Legrand DELENA DEVONNA 03/24/24 2241    Patt Alm Macho, MD 03/24/24 2244  "

## 2024-03-24 NOTE — ED Triage Notes (Signed)
 Pt reports sharp, aching epigastric abdominal pain, onset today around 1730, associated with diarrhea. Denies nausea/vomiting/fevers. She also reports having chest pain that started today as well that lasted 10 minutes.

## 2024-03-24 NOTE — ED Notes (Signed)
Ultrasound at bedside to scan patient

## 2024-03-24 NOTE — ED Notes (Signed)
 Patient transferred from waiting room to ED treatment room. Assuming pt care at this time.

## 2024-03-24 NOTE — Discharge Instructions (Addendum)
 You were seen in the emergency department today for concerns of abdominal pain. Your labs were reassuring but your CT and ultrasound appears to show stones in your gallbladder. This is likely why you noticed significant pain after eating earlier today. This pain is typically triggered after eating food that's particularly higher in fat. I think you would benefit from follow up with a general surgeon for further evaluation. If this pain is worsening, please return to the ER.

## 2024-03-24 NOTE — ED Notes (Signed)
 Patient requesting additional pain medication. Provider notified. Pending new orders.
